# Patient Record
Sex: Male | Born: 2000 | Race: White | Hispanic: No | Marital: Single | Smoking: Never smoker
Health system: Southern US, Community
[De-identification: ages and names within clinical notes are randomized; demographics above are authoritative.]

## PROBLEM LIST (undated history)

## (undated) DIAGNOSIS — L709 Acne, unspecified: Secondary | ICD-10-CM

## (undated) DIAGNOSIS — F909 Attention-deficit hyperactivity disorder, unspecified type: Secondary | ICD-10-CM

## (undated) DIAGNOSIS — Z8489 Family history of other specified conditions: Secondary | ICD-10-CM

## (undated) HISTORY — PX: ADENOIDECTOMY: SUR15

## (undated) HISTORY — PX: TONSILLECTOMY: SUR1361

---
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
## 2019-01-02 ENCOUNTER — Ambulatory Visit (INDEPENDENT_AMBULATORY_CARE_PROVIDER_SITE_OTHER): Admitting: Plastic Surgery

## 2019-01-02 ENCOUNTER — Other Ambulatory Visit (HOSPITAL_COMMUNITY)
Admission: RE | Admit: 2019-01-02 | Discharge: 2019-01-02 | Disposition: A | Discharge status: Home / Self Care | Source: Ambulatory Visit | Attending: Plastic Surgery | Admitting: Plastic Surgery

## 2019-01-02 ENCOUNTER — Other Ambulatory Visit: Payer: Self-pay

## 2019-01-02 VITALS — BP 130/83 | HR 90 | Temp 98.6°F | Ht 74.5 in | Wt 216.0 lb

## 2019-01-02 DIAGNOSIS — R2241 Localized swelling, mass and lump, right lower limb: Secondary | ICD-10-CM

## 2019-01-02 NOTE — Progress Notes (Signed)
Operative Note   DATE OF OPERATION: 01/02/2019  LOCATION:  [facility]  SURGICAL DEPARTMENT: Plastic Surgery  PREOPERATIVE DIAGNOSES: Right leg mass  POSTOPERATIVE DIAGNOSES:  same  PROCEDURE:  1. Excision of right leg mass measuring 3 cm 2. Complex closure measuring 3 cm  SURGEON: Talmadge Coventry, MD  ANESTHESIA:  Local  COMPLICATIONS: None.   INDICATIONS FOR PROCEDURE:  The patient, Andre Little is a 18 y.o. male born on [birth year], is here for treatment of right leg mass MRN: [id number]  CONSENT:  Informed consent was obtained directly from the patient. Risks, benefits and alternatives were fully discussed. Specific risks including but not limited to bleeding, infection, hematoma, seroma, scarring, pain, infection, wound healing problems, and need for further surgery were all discussed. The patient did have an ample opportunity to have questions answered to satisfaction.   DESCRIPTION OF PROCEDURE:  Local anesthesia was administered. The patient's operative site was prepped and draped in a sterile fashion. A time out was performed and all information was confirmed to be correct.  The lesion was excised with a 15 blade and tenotomy scissors.  It appeared there was a lipoma penetrating the fascia.  This was completely excised.  Hemostasis was obtained.  Circumferential undermining was performed and the skin was advanced and closed in layers with interrupted buried Monocryl sutures and interrupted 3-0 Monocryl for the skin.  The lesion excised measured 3 cm, and the total length of closure measured 3 cm.    The patient tolerated the procedure well.  There were no complications.

## 2019-01-04 LAB — SURGICAL PATHOLOGY

## 2019-09-12 IMAGING — DX DG HAND COMPLETE 3+V*R*
3 series · 3 of 3 positions shown · non-contrast
Comparison: None.

CLINICAL DATA: Dog bites to the right hand.

EXAM:
RIGHT HAND - COMPLETE 3+ VIEW

[x hand pa right]
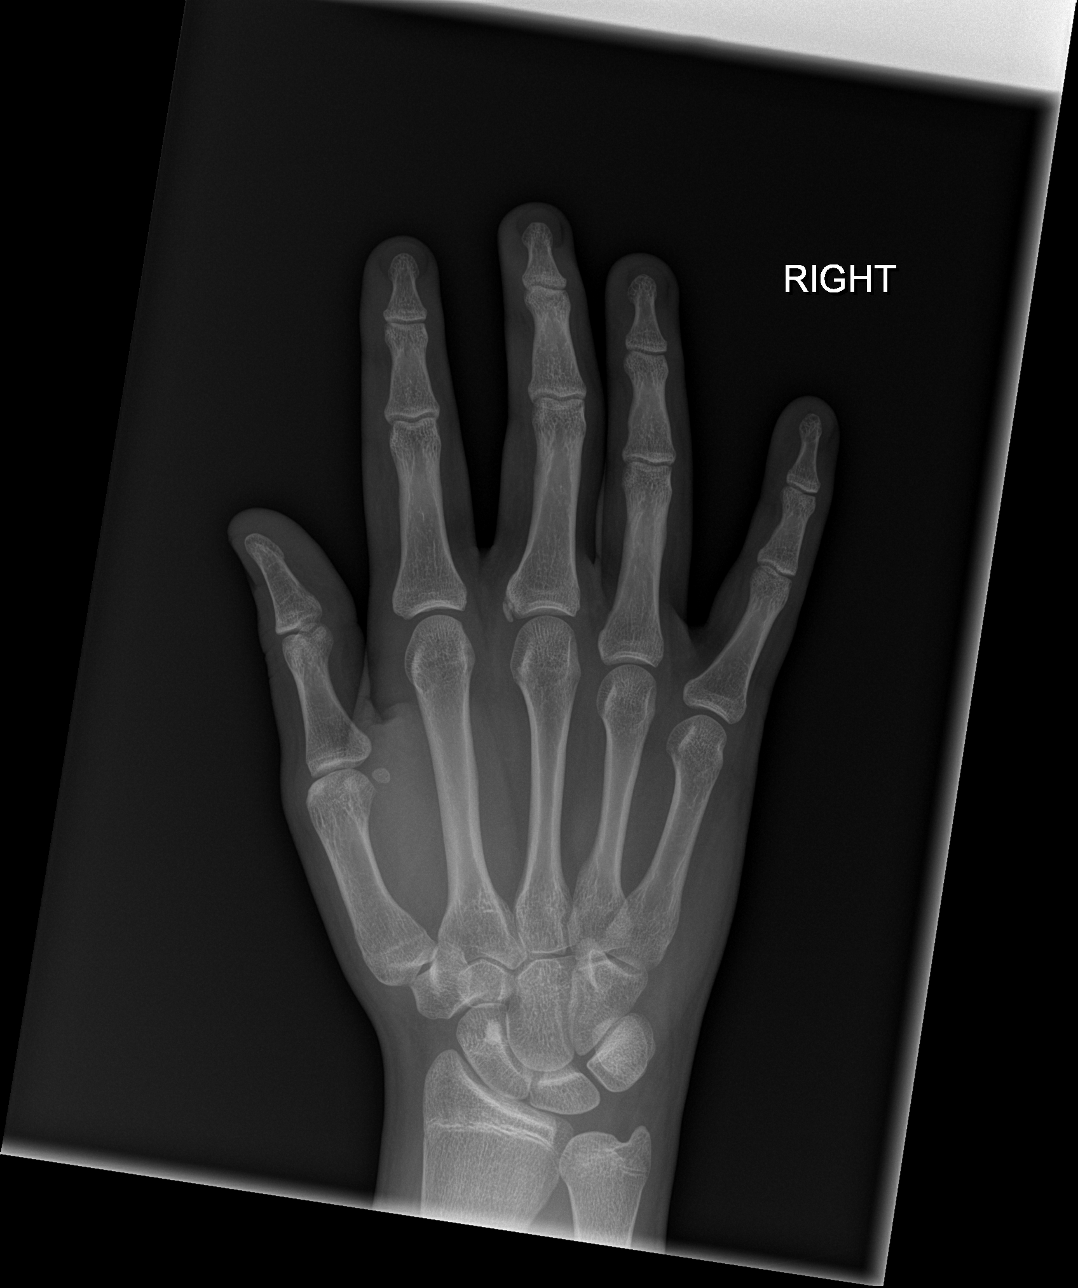

[x hand obl right]
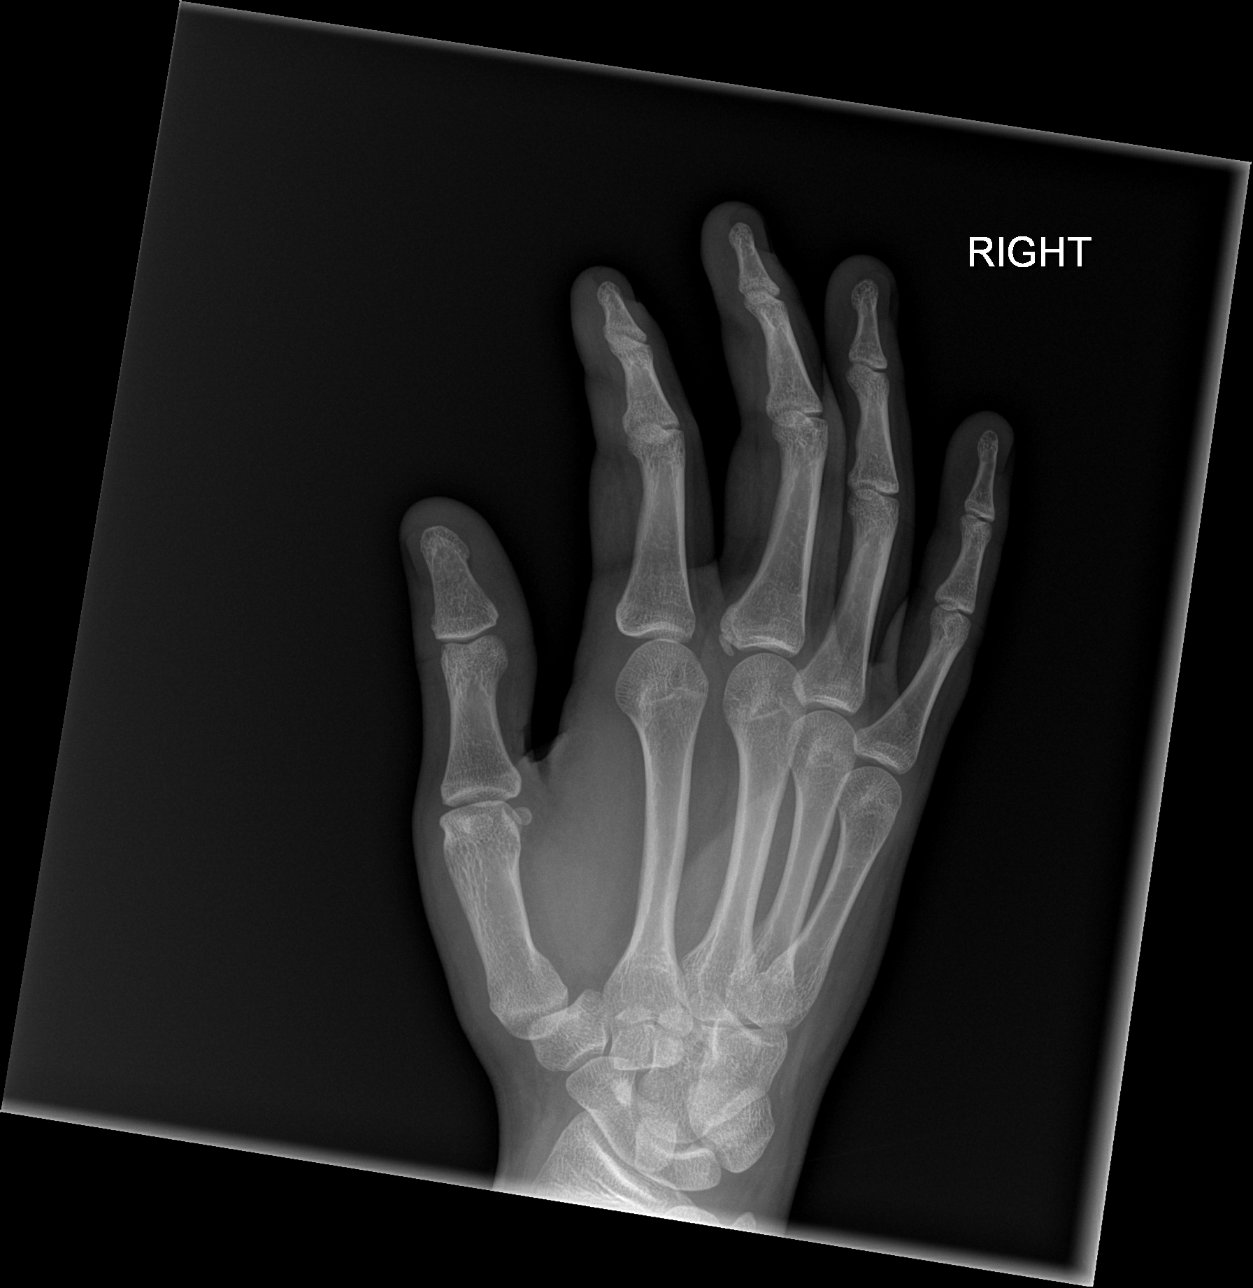

[x hand lat right]
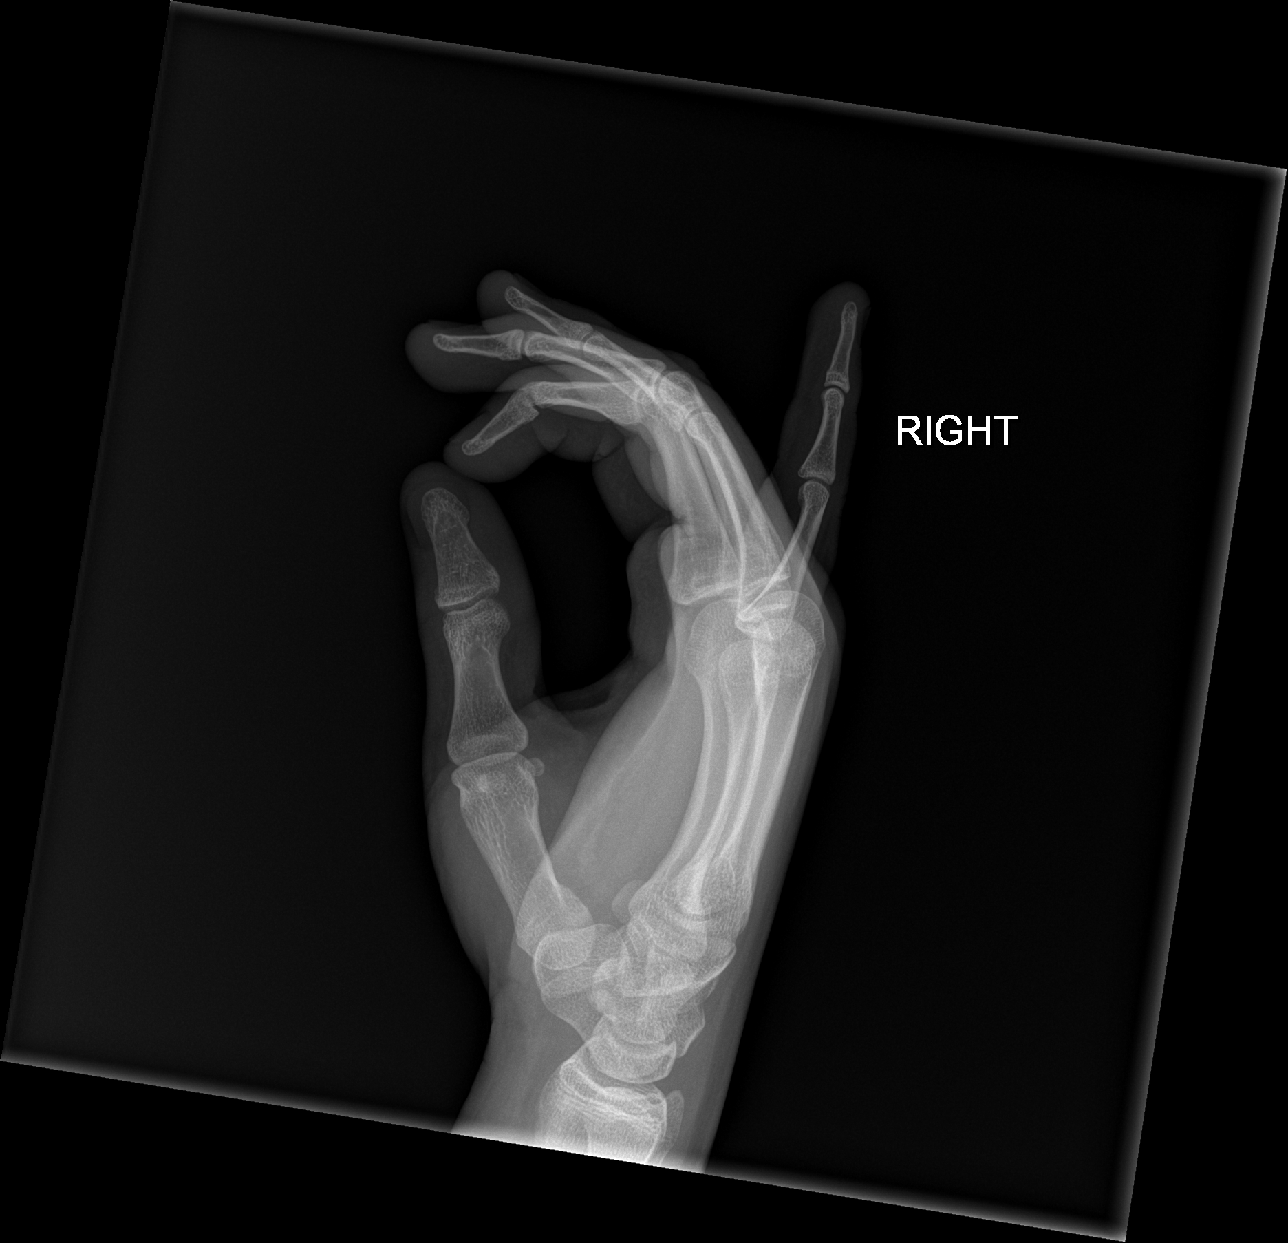

[3 of 3 positions shown; findings below may reference images not displayed]

FINDINGS: There is no evidence of fracture or dislocation. There is focal
calcification in the region of the radial collateral ligament of the
third MCP joint consistent with remote injury. This is not acute.

Benign bone island in the scaphoid.  No acute abnormalities.
IMPRESSION: No acute abnormalities.

## 2019-09-12 IMAGING — DX DG HAND COMPLETE 3+V*L*
3 series · 3 of 3 positions shown · non-contrast
Comparison: No recent prior.

CLINICAL DATA: Multiple dog bites.

EXAM:
LEFT HAND - COMPLETE 3+ VIEW

[x hand pa left]
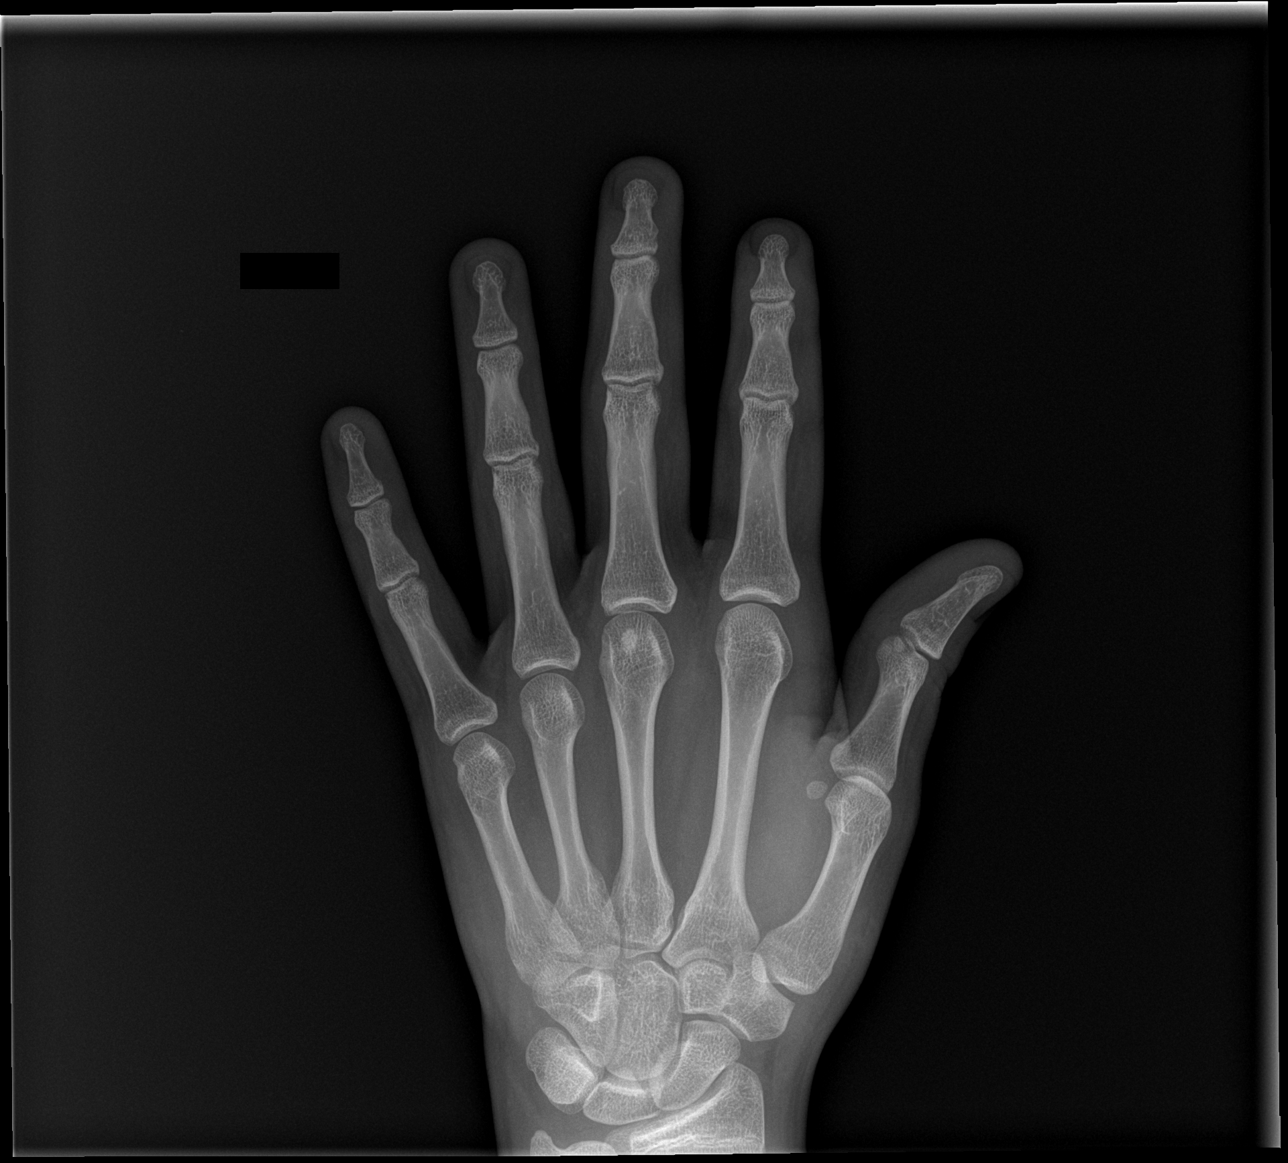

[x hand obl left]
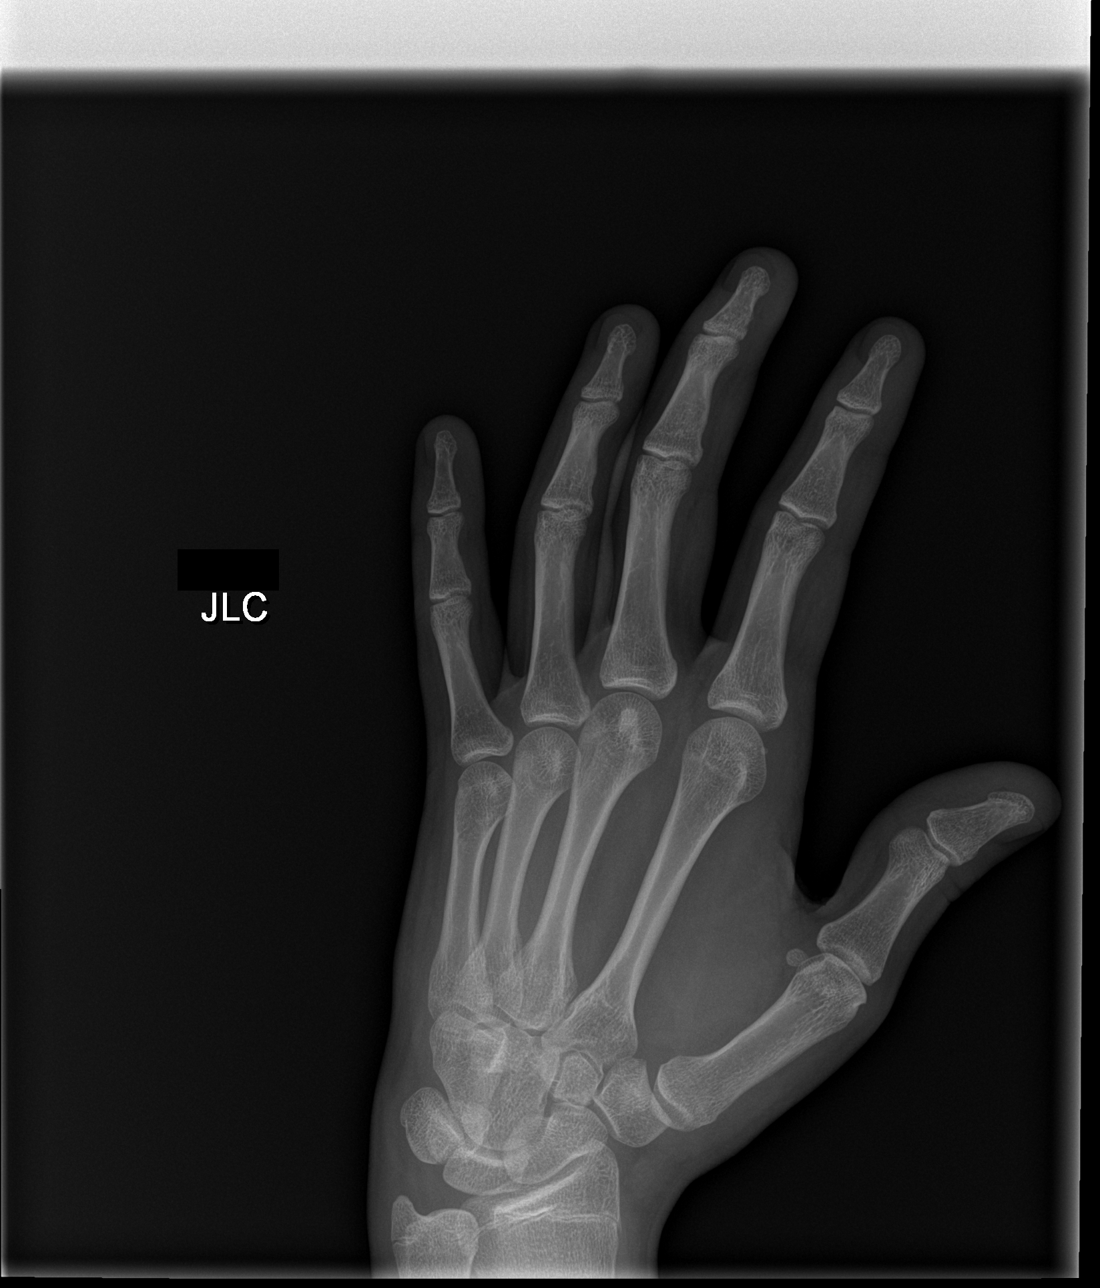

[x hand lat left]
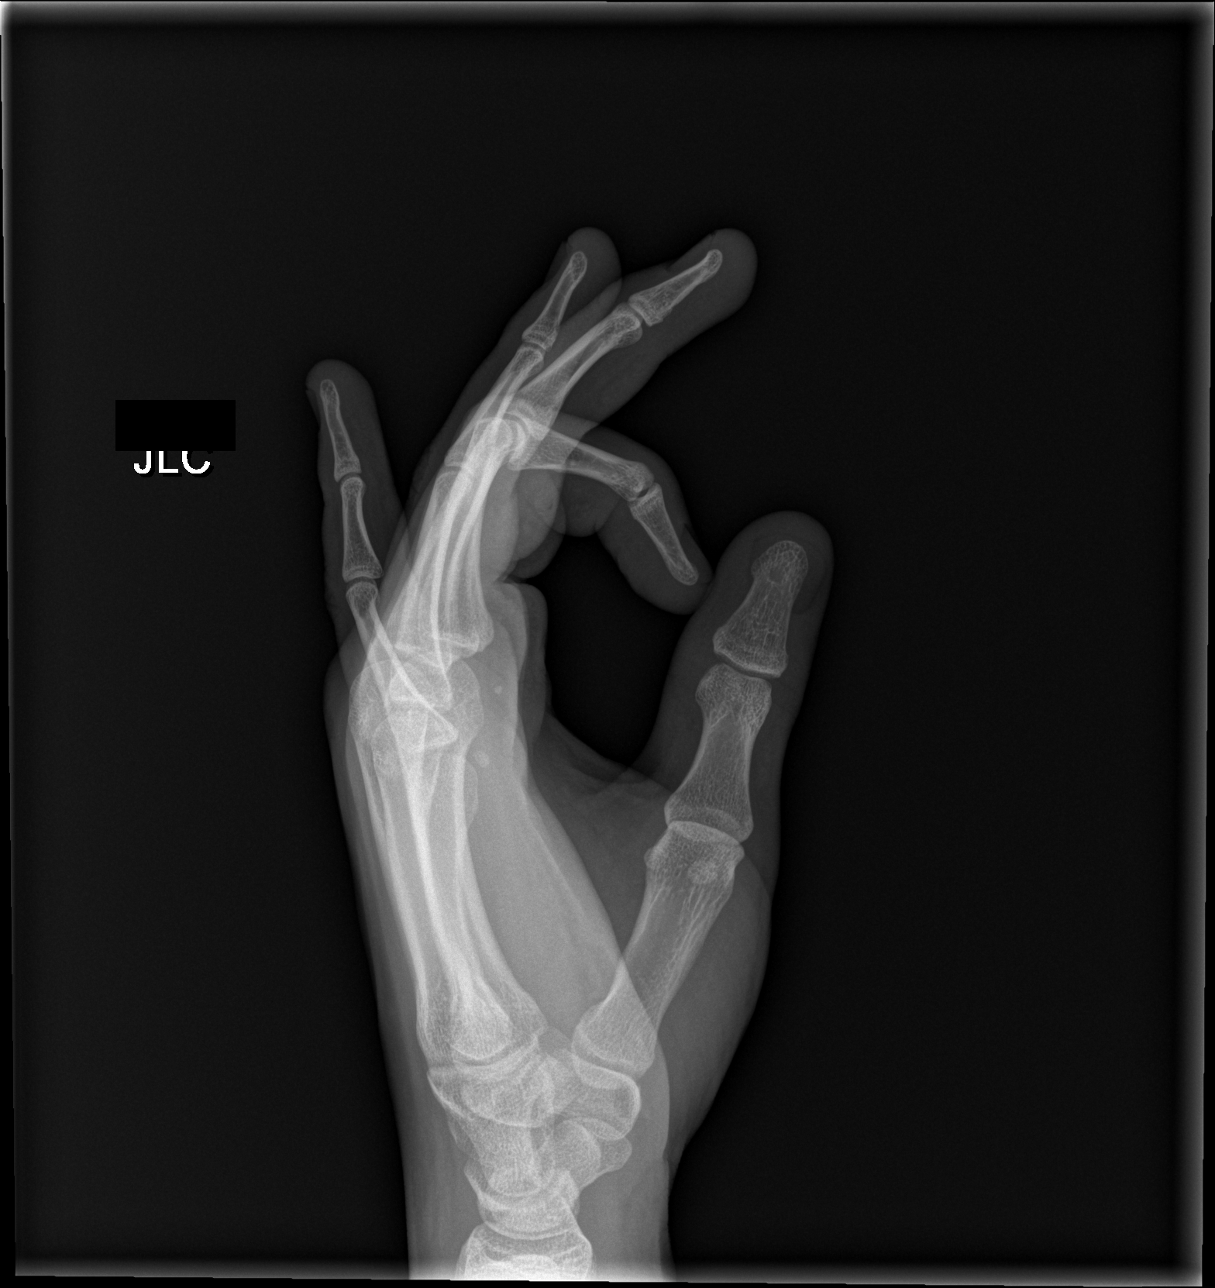

[3 of 3 positions shown; findings below may reference images not displayed]

FINDINGS: No acute bony or joint abnormality identified. No evidence of
fracture or dislocation.
IMPRESSION: No acute abnormality.

## 2019-12-11 ENCOUNTER — Ambulatory Visit: Admitting: Dermatology

## 2020-03-09 ENCOUNTER — Ambulatory Visit: Admitting: Dermatology

## 2020-03-23 ENCOUNTER — Ambulatory Visit: Admitting: Dermatology

## 2020-07-02 ENCOUNTER — Ambulatory Visit (HOSPITAL_COMMUNITY)
Admission: EM | Admit: 2020-07-02 | Discharge: 2020-07-02 | Disposition: A | Discharge status: Home / Self Care | Attending: Physician Assistant | Admitting: Physician Assistant

## 2020-07-02 ENCOUNTER — Encounter (HOSPITAL_COMMUNITY): Payer: Self-pay | Admitting: Emergency Medicine

## 2020-07-02 DIAGNOSIS — R112 Nausea with vomiting, unspecified: Secondary | ICD-10-CM | POA: Diagnosis not present

## 2020-07-02 MED ORDER — ONDANSETRON 4 MG PO TBDP: 4.0000 mg | ORAL_TABLET | Freq: Three times a day (TID) | ORAL | 0 refills | Status: DC | PRN

## 2020-07-02 NOTE — ED Triage Notes (Signed)
Patient c/o vomiting since Tuesday night, some nausea, no diarrhea.  Patient does have some dry heaving, unable to tolerate anything.  Denies any OTC meds.

## 2020-07-02 NOTE — ED Provider Notes (Signed)
[facility]    CSN: [id number] Arrival date & time: 07/02/20  1751      History   Chief Complaint Chief Complaint  Patient presents with  . Emesis    HPI Andre Little is a 20 y.o. male.   20 year old male comes in for 2 day history of nausea/vomiting. Nausea with dry heaving and 2-3 episodes of NBNB emesis per day. Has been able to tolerate oral intake since. States now feels some abdominal soreness from vomiting. Denies diarrhea. Denies fever, chills, body aches. Denies URI symptoms.      Past Medical History:  Diagnosis Date  . Acne   . ADHD (attention deficit hyperactivity disorder)   . Family history of adverse reaction to anesthesia    Mother    Patient Active Problem List   Diagnosis Date Noted  . Hypertrophic scar of skin 09/25/2018  . Occipital headache 06/05/2017  . Bilateral occipital neuralgia 08/01/2016  . ADHD (attention deficit hyperactivity disorder), combined type 07/16/2015  . Generalized anxiety disorder 07/16/2015  . 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 ADENOIDECTOMY    . COLONOSCOPY N/A 03/29/2016   Procedure: COLONOSCOPY;  Surgeon: Adelene Amas, MD;  Location: [facility] ENDOSCOPY;  Service: Gastroenterology;  Laterality: N/A;  . ESOPHAGOGASTRODUODENOSCOPY N/A 03/29/2016   Procedure: ESOPHAGOGASTRODUODENOSCOPY (EGD);  Surgeon: Adelene Amas, MD;  Location: [facility] ENDOSCOPY;  Service: Gastroenterology;  Laterality: N/A;  . TONSILLECTOMY         Home Medications    Prior to Admission medications   Medication Sig Start Date End Date Taking? Authorizing Provider  ibuprofen (ADVIL,MOTRIN) 600 MG tablet Take 1 tablet (600 mg total) by mouth every 6 (six) hours as needed for fever, mild pain or moderate pain. 01/26/16  Yes Everlene Farrier, PA-C  Melatonin 5 MG TABS Take 5 mg by mouth at bedtime.   Yes [provider]  ondansetron (ZOFRAN ODT) 4 MG disintegrating tablet Take 1  tablet (4 mg total) by mouth every 8 (eight) hours as needed for nausea or vomiting. 07/02/20  Yes Kalandra Masters V, PA-C  hyoscyamine (OSCIMIN) 0.125 MG tablet TAKE 1 TO 2 TABLETS AS NEEDED FOR CRAMPING, DIARRHEA EVERY 4 HOURS 03/13/17 07/02/20  Adelene Amas, MD    Family History Family History  Problem Relation Age of Onset  . Miscarriages / [place] Mother   . Asthma Father   . COPD Father   . Cancer Maternal Grandmother   . Stroke Maternal Grandmother   . Breast cancer Maternal Grandmother   . Colon cancer Maternal Grandmother   . Diabetes Paternal Grandmother   . Cancer Paternal Grandfather   . Hearing loss Paternal Grandfather   . Atrial fibrillation Paternal Grandfather   . Migraines Sister     Social History Social History   Tobacco Use  . Smoking status: Never Smoker  . Smokeless tobacco: Never Used  Vaping Use  . Vaping Use: Never used  Substance Use Topics  . Alcohol use: No  . Drug use: No     Allergies   Patient has no known allergies.   Review of Systems Review of Systems  Reason unable to perform ROS: See HPI as above.     Physical Exam Triage Vital Signs ED Triage Vitals  Enc Vitals Group     BP 07/02/20 1847 108/64     Pulse Rate 07/02/20 1847 60     Resp --      Temp 07/02/20 1847  98.8 F (37.1 C)     Temp Source 07/02/20 1847 Oral     SpO2 07/02/20 1847 95 %     Weight --      Height --      Head Circumference --      Peak Flow --      Pain Score 07/02/20 1849 4     Pain Loc --      Pain Edu? --      Excl. in GC? --    No data found.  Updated Vital Signs BP 108/64 (BP Location: Left Arm)   Pulse 60   Temp 98.8 F (37.1 C) (Oral)   SpO2 95%   Visual Acuity Right Eye Distance:   Left Eye Distance:   Bilateral Distance:    Right Eye Near:   Left Eye Near:    Bilateral Near:     Physical Exam Constitutional:      General: He is not in acute distress.    Appearance: Normal appearance. He is not ill-appearing, toxic-appearing  or diaphoretic.  HENT:     Head: Normocephalic and atraumatic.  Cardiovascular:     Rate and Rhythm: Normal rate and regular rhythm.  Pulmonary:     Effort: Pulmonary effort is normal. No respiratory distress.     Comments: LCTAB Abdominal:     General: Bowel sounds are normal.     Palpations: Abdomen is soft.     Tenderness: There is no abdominal tenderness. There is no right CVA tenderness, left CVA tenderness, guarding or rebound.  Musculoskeletal:     Cervical back: Normal range of motion and neck supple.  Skin:    General: Skin is warm and dry.  Neurological:     Mental Status: He is alert and oriented to person, place, and time.      UC Treatments / Results  Labs (all labs ordered are listed, but only abnormal results are displayed) Labs Reviewed - No data to display  EKG   Radiology No results found.  Procedures Procedures (including critical care time)  Medications Ordered in UC Medications - No data to display  Initial Impression / Assessment and Plan / UC Course  I have reviewed the triage vital signs and the nursing notes.  Pertinent labs & imaging results that were available during my care of the patient were reviewed by me and considered in my medical decision making (see chart for details).    Discussed with patient no alarming signs on exam. Zofran for nausea. Push fluids. Bland diet, advance as tolerated. Return precautions given.  Final Clinical Impressions(s) / UC Diagnoses   Final diagnoses:  Intractable vomiting with nausea, unspecified vomiting type    ED Prescriptions    Medication Sig Dispense Auth. Provider   ondansetron (ZOFRAN ODT) 4 MG disintegrating tablet Take 1 tablet (4 mg total) by mouth every 8 (eight) hours as needed for nausea or vomiting. 20 tablet Belinda Fisher, PA-C     PDMP not reviewed this encounter.   Belinda Fisher, PA-C 07/02/20 1921

## 2020-07-02 NOTE — Discharge Instructions (Signed)
Zofran for nausea and vomiting as needed. Keep hydrated, you urine should be clear to pale yellow in color. Bland diet, advance as tolerated. Monitor for any worsening of symptoms, nausea or vomiting not controlled by medication, worsening abdominal pain, fever, go to the emergency department for further evaluation needed.  ° °

## 2021-03-15 ENCOUNTER — Ambulatory Visit
Admission: EM | Admit: 2021-03-15 | Discharge: 2021-03-15 | Disposition: A | Discharge status: Home / Self Care | Payer: TRICARE For Life (TFL) | Attending: Family Medicine | Admitting: Family Medicine

## 2021-03-15 ENCOUNTER — Other Ambulatory Visit: Payer: Self-pay

## 2021-03-15 DIAGNOSIS — J069 Acute upper respiratory infection, unspecified: Secondary | ICD-10-CM | POA: Diagnosis not present

## 2021-03-15 DIAGNOSIS — R42 Dizziness and giddiness: Secondary | ICD-10-CM | POA: Diagnosis not present

## 2021-03-15 LAB — POCT RAPID STREP A (OFFICE): Rapid Strep A Screen: NEGATIVE

## 2021-03-15 MED ORDER — MECLIZINE HCL 25 MG PO TABS: 25.0000 mg | ORAL_TABLET | Freq: Three times a day (TID) | ORAL | 0 refills | Status: DC | PRN

## 2021-03-15 MED ORDER — PROMETHAZINE-DM 6.25-15 MG/5ML PO SYRP: 5.0000 mL | ORAL_SOLUTION | Freq: Four times a day (QID) | ORAL | 0 refills | Status: DC | PRN

## 2021-03-15 NOTE — ED Triage Notes (Signed)
Pt reports sore throat, lightheaded with fast movement, nausea, chest pressure when taking a deep breath.

## 2021-03-15 NOTE — ED Provider Notes (Signed)
[facility]    CSN: [id number] Arrival date & time: 03/15/21  1323      History   Chief Complaint Chief Complaint  Patient presents with   Dizziness   Headache    HPI Andre Little is a 21 y.o. male.   Presenting today with 3-day history of sore throat, nausea, room spinning dizziness with certain fast movements, chest pressure with taking deep breaths, productive cough, fever, fatigue, body aches.  Denies chest pain, abdominal pain, nausea vomiting or diarrhea, shortness of breath.  Taking ibuprofen and Tylenol with minimal relief.  Recent COVID-positive contact.  Home test early on in symptoms was negative.   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 Authorizing Provider  meclizine (ANTIVERT) 25 MG tablet Take 1 tablet (25 mg total) by mouth 3 (three) times daily as needed for dizziness. 03/15/21  Yes Particia Nearing, PA-C  promethazine-dextromethorphan (PROMETHAZINE-DM) 6.25-15  MG/5ML syrup Take 5 mLs by mouth 4 (four) times daily as needed. 03/15/21  Yes Particia Nearing, PA-C  ibuprofen (ADVIL,MOTRIN) 600 MG tablet Take 1 tablet (600 mg total) by mouth every 6 (six) hours as needed for fever, mild pain or moderate pain. 01/26/16   Everlene Farrier, PA-C  Melatonin 5 MG TABS Take 5 mg by mouth at bedtime.    [provider]  ondansetron (ZOFRAN ODT) 4 MG disintegrating tablet Take 1 tablet (4 mg total) by mouth every 8 (eight) hours as needed for nausea or vomiting. 07/02/20   Cathie Hoops, Amy V, PA-C  hyoscyamine (OSCIMIN) 0.125 MG tablet TAKE 1 TO 2 TABLETS AS NEEDED FOR CRAMPING, DIARRHEA EVERY 4 HOURS 03/13/17 07/02/20  Adelene Amas, MD    Family History Family History  Problem Relation Age of Onset   Miscarriages / Stillbirths Mother    Asthma Father    COPD Father    Cancer Maternal Grandmother    Stroke Maternal Grandmother    Breast cancer Maternal Grandmother    Colon cancer Maternal Grandmother    Diabetes Paternal Grandmother    Cancer Paternal Grandfather    Hearing loss Paternal Grandfather    Atrial fibrillation Paternal Grandfather    Migraines Sister     Social History Social History   Tobacco Use   Smoking status: Never   Smokeless tobacco: Never  Vaping Use   Vaping Use: Never used  Substance Use Topics   Alcohol use: Never   Drug use: Never     Allergies   Patient has no known allergies.  Review of Systems Review of Systems Per HPI  Physical Exam Triage Vital Signs ED Triage Vitals  Enc Vitals Group     BP 03/15/21 1356 133/87     Pulse Rate 03/15/21 1356 80     Resp 03/15/21 1356 18     Temp 03/15/21 1356 98.3 F (36.8 C)     Temp Source 03/15/21 1356 Oral     SpO2 03/15/21 1356 97 %     Weight --      Height --      Head Circumference --      Peak Flow --      Pain Score 03/15/21 1357 4     Pain Loc --      Pain Edu? --      Excl. in [place]? --    No data found.  Updated Vital Signs BP 133/87 (BP  Location: Right Arm)    Pulse 80    Temp 98.3 F (36.8 C) (Oral)    Resp 18    SpO2 97%   Visual Acuity Right Eye Distance:   Left Eye Distance:   Bilateral Distance:    Right Eye Near:   Left Eye Near:    Bilateral Near:     Physical Exam Vitals and nursing note reviewed.  Constitutional:      Appearance: Normal appearance. He is well-developed.  HENT:     Head: Atraumatic.     Right Ear: External ear normal.     Left Ear: External ear normal.     Nose: Rhinorrhea present.     Mouth/Throat:     Pharynx: Posterior oropharyngeal erythema present. No oropharyngeal exudate.  Eyes:     Extraocular Movements: Extraocular movements intact.     Conjunctiva/sclera: Conjunctivae normal.     Pupils: Pupils are equal, round, and reactive to light.  Cardiovascular:     Rate and Rhythm: Normal rate and regular rhythm.  Pulmonary:     Effort: Pulmonary effort is normal. No respiratory distress.     Breath sounds: Normal breath sounds. No wheezing or rales.  Musculoskeletal:        General: Normal range of motion.     Cervical back: Normal range of motion and neck supple.  Lymphadenopathy:     Cervical: No cervical adenopathy.  Skin:    General: Skin is warm and dry.  Neurological:     General: No focal deficit present.     Mental Status: He is alert and oriented to person, place, and time.     Cranial Nerves: No cranial nerve deficit.     Motor: No weakness.     Gait: Gait normal.  Psychiatric:        Mood and Affect: Mood normal.        Behavior: Behavior normal.        Thought Content: Thought content normal.        Judgment: Judgment normal.     UC Treatments / Results  Labs (all labs ordered are listed, but only abnormal results are displayed) Labs Reviewed  COVID-19, FLU A+B NAA  POCT RAPID STREP A (OFFICE)    EKG   Radiology No results found.  Procedures Procedures (including critical care time)  Medications Ordered in UC Medications - No data to  display  Initial Impression / Assessment and Plan / UC Course  I have reviewed the triage vital signs and the nursing notes.  Pertinent labs & imaging results that were available during my care of  the patient were reviewed by me and considered in my medical decision making (see chart for details).     Vital signs benign and reassuring, exam overall reassuring and suspect viral upper respiratory infection.  Also appears to have some vertigo type component with his illness.  No focal neurologic deficits on exam.  Will treat with meclizine, Phenergan DM, supportive over-the-counter medications and home care.  Work note given.  Return for acutely worsening symptoms.  COVID and flu testing pending.  Final Clinical Impressions(s) / UC Diagnoses   Final diagnoses:  Viral URI with cough  Vertigo   Discharge Instructions   None    ED Prescriptions     Medication Sig Dispense Auth. Provider   meclizine (ANTIVERT) 25 MG tablet Take 1 tablet (25 mg total) by mouth 3 (three) times daily as needed for dizziness. 10 tablet Volney American, [place]   promethazine-dextromethorphan (PROMETHAZINE-DM) 6.25-15 MG/5ML syrup Take 5 mLs by mouth 4 (four) times daily as needed. 100 mL Volney American, [place]      PDMP not reviewed this encounter.   Volney American, [place] 03/15/21 1454

## 2021-03-16 LAB — COVID-19, FLU A+B NAA
Influenza A, NAA: NOT DETECTED
Influenza B, NAA: NOT DETECTED
SARS-CoV-2, NAA: NOT DETECTED

## 2021-12-27 ENCOUNTER — Emergency Department (HOSPITAL_COMMUNITY)
Admission: EM | Admit: 2021-12-27 | Discharge: 2021-12-27 | Disposition: A | Discharge status: Home / Self Care | Attending: Emergency Medicine | Admitting: Emergency Medicine

## 2021-12-27 ENCOUNTER — Other Ambulatory Visit: Payer: Self-pay

## 2021-12-27 ENCOUNTER — Encounter (HOSPITAL_COMMUNITY): Payer: Self-pay | Admitting: *Deleted

## 2021-12-27 DIAGNOSIS — S0502XA Injury of conjunctiva and corneal abrasion without foreign body, left eye, initial encounter: Secondary | ICD-10-CM | POA: Insufficient documentation

## 2021-12-27 DIAGNOSIS — H5712 Ocular pain, left eye: Secondary | ICD-10-CM | POA: Diagnosis present

## 2021-12-27 DIAGNOSIS — X58XXXA Exposure to other specified factors, initial encounter: Secondary | ICD-10-CM | POA: Diagnosis not present

## 2021-12-27 MED ORDER — OFLOXACIN 0.3 % OP SOLN: 1.0000 [drp] | Freq: Four times a day (QID) | OPHTHALMIC | 0 refills | Status: DC

## 2021-12-27 MED ORDER — TETRACAINE HCL 0.5 % OP SOLN
2.0000 [drp] | Freq: Once | OPHTHALMIC | Status: AC
Start: 2021-12-27 — End: 2021-12-27
  Administered 2021-12-27: 2 [drp] via OPHTHALMIC
  Filled 2021-12-27: qty 4

## 2021-12-27 MED ORDER — FLUORESCEIN SODIUM 1 MG OP STRP
1.0000 | ORAL_STRIP | Freq: Once | OPHTHALMIC | Status: AC
  Administered 2021-12-27: 1 via OPHTHALMIC
  Filled 2021-12-27: qty 1

## 2021-12-27 NOTE — Discharge Instructions (Addendum)
You have been seen today for your complaint of left eye pain. Your exam showed that you have a corneal abrasion of the left eye Your discharge medications include ofloxacin.  This is an antibiotic drop.  You should use it as prescribed.  You should use it for the next 7 days. Follow up with: Your primary care provider in 3 to 4 days if it is not getting better Please seek immediate medical care if you develop any of the following symptoms: You have severe eye pain that does not get better with medicine. You have vision loss. At this time there does not appear to be the presence of an emergent medical condition, however there is always the potential for conditions to change. Please read and follow the below instructions.  Do not take your medicine if  develop an itchy rash, swelling in your mouth or lips, or difficulty breathing; call 911 and seek immediate emergency medical attention if this occurs.  You may review your lab tests and imaging results in their entirety on your MyChart account.  Please discuss all results of fully with your primary care provider and other specialist at your follow-up visit.  Note: Portions of this text may have been transcribed using voice recognition software. Every effort was made to ensure accuracy; however, inadvertent computerized transcription errors may still be present.
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
## 2021-12-27 NOTE — ED Notes (Signed)
Patient verbalizes understanding of discharge instructions. Opportunity for questioning and answers were provided. Armband removed by staff, pt discharged from ED. Ambulated out to lobby  

## 2021-12-27 NOTE — ED Notes (Signed)
Patient verbalizes understanding of discharge instructions. Opportunity for questioning and answers were provided. Armband removed by staff, pt discharged from ED. Ambulated uot to lobby
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
## 2021-12-27 NOTE — ED Notes (Signed)
Tonopen also at bedside

## 2022-03-23 ENCOUNTER — Other Ambulatory Visit: Payer: Self-pay

## 2022-03-23 ENCOUNTER — Emergency Department (HOSPITAL_COMMUNITY)
Admission: EM | Admit: 2022-03-23 | Discharge: 2022-03-23 | Disposition: A | Discharge status: Home / Self Care | Attending: Emergency Medicine | Admitting: Emergency Medicine

## 2022-03-23 ENCOUNTER — Encounter (HOSPITAL_COMMUNITY): Payer: Self-pay

## 2022-03-23 DIAGNOSIS — U071 COVID-19: Secondary | ICD-10-CM | POA: Diagnosis not present

## 2022-03-23 DIAGNOSIS — R519 Headache, unspecified: Secondary | ICD-10-CM | POA: Diagnosis present

## 2022-03-23 LAB — RESP PANEL BY RT-PCR (RSV, FLU A&B, COVID)  RVPGX2
Influenza A by PCR: NEGATIVE
Influenza B by PCR: NEGATIVE
Resp Syncytial Virus by PCR: NEGATIVE
SARS Coronavirus 2 by RT PCR: POSITIVE — AB

## 2022-03-23 MED ORDER — PSEUDOEPHEDRINE HCL 30 MG PO TABS: 60.0000 mg | ORAL_TABLET | Freq: Four times a day (QID) | ORAL | 0 refills | Status: DC | PRN

## 2022-03-23 NOTE — Discharge Instructions (Addendum)
Thank you for allowing me to be part of your care today.  I have sent a prescription for Sudafed to your pharmacy.  You may take this medication every 6 hours as needed for sinus congestion and pressure.  If you have congestion, but are not experiencing sinus pressure that is bothersome, I recommend and over-the-counter antihistamine such as Zyrtec (cetirizine) or Claritin (loratadine).  These are non-drowsy medications.    For headache, body aches, and should you develop fever: take 800 mg of ibuprofen every 6-8 hours as needed.  You may alternate this with Tylenol 1000 mg.  DO NOT exceed 3200 mg of ibuprofen in a 24-hour period.  DO NOT exceed 4000 mg of Tylenol in a 24-hour period.   I recommend getting plenty of rest and staying well hydrated.  Follow-up with your primary care provider if you have prolonged symptoms.    Please follow current CDC guidelines regarding quarantining and mask wearing: If you test positive for COVID-19, stay home for at least 5 days and isolate from others in your home.  Day 1 is the first full day after the day your symptoms started.  You may end isolation after day 5 if you are fever-free for 24 hours (without the use of fever-reducing medication).

## 2022-03-23 NOTE — ED Provider Notes (Signed)
[facility] Provider Note   CSN: [id number] Arrival date & time: 03/23/22  [id number]     History  Chief Complaint  Patient presents with   Chills    Andre Little is a 22 y.o. male presents to the ED complaining of body aches, chills, congestion, sinus pressure, rhinorrhea, sore throat and headache for the past 3 days.  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  He has not taken any over-the-counter medication for his symptoms.  Denies chest tightness, cough, shortness of breath, nausea, vomiting, diarrhea, trouble swallowing, otalgia, fever, chills.         Home Medications Prior to Admission medications   Medication Sig Start Date End Date Taking? Authorizing Provider  pseudoephedrine (SUDAFED) 30 MG tablet Take 2 tablets (60 mg total) by mouth every 6 (six) hours as needed for congestion. 03/23/22  Yes [name], PA  ibuprofen (ADVIL,MOTRIN) 600 MG tablet Take 1 tablet (600 mg total) by mouth every 6 (six) hours as needed for fever, mild pain or moderate pain. 01/26/16   Waynetta Pean, PA-C  meclizine (ANTIVERT) 25 MG tablet Take 1 tablet (25 mg total) by mouth 3 (three) times daily as needed for dizziness. 03/15/21   Volney American, PA-C  Melatonin 5 MG TABS Take 5 mg by mouth at bedtime.    [provider]  ofloxacin (OCUFLOX) 0.3 % ophthalmic solution Place 1 drop into the left eye 4 (four) times daily. 12/27/21   Schutt, G[place]on Folk, PA-C  ondansetron (ZOFRAN ODT) 4 MG disintegrating tablet Take 1 tablet (4 mg total) by mouth every 8 (eight) hours as needed for nausea or vomiting. 07/02/20   Tasia Catchings, Amy V, PA-C  promethazine-dextromethorphan (PROMETHAZINE-DM) 6.25-15 MG/5ML syrup Take 5 mLs by mouth 4 (four) times daily as needed. 03/15/21   Volney American, PA-C  hyoscyamine (OSCIMIN) 0.125 MG tablet TAKE 1 TO 2 TABLETS AS NEEDED FOR CRAMPING, DIARRHEA EVERY 4 HOURS 03/13/17 07/02/20  Joycelyn Rua, MD       Allergies    Patient has no known allergies.    Review of Systems   Review of Systems  Constitutional:  Negative for chills and fever.  HENT:  Positive for congestion, rhinorrhea, sinus pressure and sore throat. Negative for ear pain and trouble swallowing.   Respiratory:  Negative for cough, chest tightness and shortness of breath.   Gastrointestinal:  Negative for diarrhea, nausea and vomiting.  Neurological:  Positive for headaches.    Physical Exam Updated Vital Signs BP (!) 149/91   Pulse 94   Temp 98.5 F (36.9 C)   Resp 18   Wt 83 kg   SpO2 96%   BMI 23.49 kg/m  Physical Exam Vitals and nursing note reviewed.  Constitutional:      General: He is not in acute distress.    Appearance: Normal appearance. He is not ill-appearing or diaphoretic.  HENT:     Nose: Congestion and rhinorrhea present. Rhinorrhea is clear.     Mouth/Throat:     Lips: Pink.     Mouth: Mucous membranes are moist.     Pharynx: Oropharynx is clear. Uvula midline. Posterior oropharyngeal erythema present. No pharyngeal swelling or oropharyngeal exudate.  Cardiovascular:     Rate and Rhythm: Normal rate and regular rhythm.     Heart sounds: Normal heart sounds.  Pulmonary:     Effort: Pulmonary effort is normal.     Breath sounds: Normal breath sounds  and air entry. No wheezing.  Lymphadenopathy:     Cervical: No cervical adenopathy.  Neurological:     Mental Status: He is alert. Mental status is at baseline.  Psychiatric:        Mood and Affect: Mood normal.        Behavior: Behavior normal.     ED Results / Procedures / Treatments   Labs (all labs ordered are listed, but only abnormal results are displayed) Labs Reviewed  RESP PANEL BY RT-PCR (RSV, FLU A&B, COVID)  RVPGX2 - Abnormal; Notable for the following components:      Result Value   SARS Coronavirus 2 by RT PCR POSITIVE (*)    All other components within normal limits    EKG None  Radiology No results  found.  Procedures Procedures    Medications Ordered in ED Medications - No data to display  ED Course/ Medical Decision Making/ A&P                             Medical Decision Making Risk OTC drugs.   This patient presents to the ED with chief complaint(s) of flu-like symptoms for the past 3 days.  The complaint involves an extensive differential diagnosis and also carries with it a high risk of complications and morbidity.    The differential diagnosis includes COVID, influenza, RSV, adenovirus, URI of other viral etiology, viral pharyngitis   The initial plan is to obtain respiratory swab  Initial Assessment:   Exam significant for erythematous posterior oropharynx with post nasal drip present, clear rhinorrhea, and congestion.  Heart rate is normal in the 90s with regular rhythm.  Lungs are clear to auscultation bilaterally.  Patient does not appear to be in acute distress.  He speaks in full sentences.  He has frontal sinus pressure as well.    Independent ECG/labs interpretation:  The following labs were independently interpreted:  COVID+, RSV and influenza negative  Independent visualization and interpretation of imaging: I independently visualized the following imaging with scope of interpretation limited to determining acute life threatening conditions related to emergency care: Not indicated  Treatment and Reassessment: Discussed supportive care measures and medications he can take for symptoms including NSAIDs, Tylenol, 2nd generation antihistamines, and Sudafed for sinus pressure.  Encouraged him to rest and stay well hydrated.  Discussed CDC guidelines for isolation.  Patient states he will be going to [facility] following ED visit.  Recommended PCP follow-up if he has prolonged symptoms.    Disposition:   The patient has been appropriately medically screened and/or stabilized in the ED. I have low suspicion for any other emergent medical condition which would  require further screening, evaluation or treatment in the ED or require inpatient management. At time of discharge the patient is hemodynamically stable and in no acute distress. I have discussed work-up results and diagnosis with patient and answered all questions. Patient is agreeable with discharge plan. We discussed strict return precautions for returning to the emergency department and they verbalized understanding.            Final Clinical Impression(s) / ED Diagnoses Final diagnoses:  [id number]    Rx / DC Orders ED Discharge Orders          Ordered    pseudoephedrine (SUDAFED) 30 MG tablet  Every 6 hours PRN        03/23/22 0718  Theressa Stamps R, [place] 03/23/22 0732    Valarie Merino, MD 03/23/22 [id number]

## 2022-03-23 NOTE — ED Triage Notes (Signed)
Body aches, chills, runny nose, headache x2 days.

## 2022-03-30 ENCOUNTER — Ambulatory Visit: Admitting: Dermatology

## 2022-06-17 ENCOUNTER — Encounter (HOSPITAL_BASED_OUTPATIENT_CLINIC_OR_DEPARTMENT_OTHER): Payer: Self-pay | Admitting: Emergency Medicine

## 2022-06-17 ENCOUNTER — Emergency Department (HOSPITAL_BASED_OUTPATIENT_CLINIC_OR_DEPARTMENT_OTHER)
Admission: EM | Admit: 2022-06-17 | Discharge: 2022-06-17 | Disposition: A | Discharge status: Home / Self Care | Attending: Emergency Medicine | Admitting: Emergency Medicine

## 2022-06-17 ENCOUNTER — Emergency Department (HOSPITAL_BASED_OUTPATIENT_CLINIC_OR_DEPARTMENT_OTHER)

## 2022-06-17 ENCOUNTER — Other Ambulatory Visit: Payer: Self-pay

## 2022-06-17 DIAGNOSIS — R1013 Epigastric pain: Secondary | ICD-10-CM | POA: Diagnosis not present

## 2022-06-17 DIAGNOSIS — R1010 Upper abdominal pain, unspecified: Secondary | ICD-10-CM

## 2022-06-17 DIAGNOSIS — R109 Unspecified abdominal pain: Secondary | ICD-10-CM | POA: Diagnosis present

## 2022-06-17 DIAGNOSIS — R1012 Left upper quadrant pain: Secondary | ICD-10-CM | POA: Diagnosis not present

## 2022-06-17 LAB — URINALYSIS, ROUTINE W REFLEX MICROSCOPIC
Bilirubin Urine: NEGATIVE
Glucose, UA: NEGATIVE mg/dL
Hgb urine dipstick: NEGATIVE
Ketones, ur: NEGATIVE mg/dL
Leukocytes,Ua: NEGATIVE
Nitrite: NEGATIVE
Protein, ur: NEGATIVE mg/dL
Specific Gravity, Urine: 1.019 (ref 1.005–1.030)
pH: 5.5 (ref 5.0–8.0)

## 2022-06-17 LAB — COMPREHENSIVE METABOLIC PANEL
ALT: 15 U/L (ref 0–44)
AST: 16 U/L (ref 15–41)
Albumin: 5 g/dL (ref 3.5–5.0)
Alkaline Phosphatase: 58 U/L (ref 38–126)
Anion gap: 10 (ref 5–15)
BUN: 15 mg/dL (ref 6–20)
CO2: 26 mmol/L (ref 22–32)
Calcium: 9.8 mg/dL (ref 8.9–10.3)
Chloride: 103 mmol/L (ref 98–111)
Creatinine, Ser: 0.84 mg/dL (ref 0.61–1.24)
GFR, Estimated: >60 mL/min (ref >60)
Glucose, Bld: 105 mg/dL — ABNORMAL HIGH (ref 70–99)
Potassium: 4 mmol/L (ref 3.5–5.1)
Sodium: 139 mmol/L (ref 135–145)
Total Bilirubin: 0.5 mg/dL (ref 0.3–1.2)
Total Protein: 7.9 g/dL (ref 6.5–8.1)

## 2022-06-17 LAB — CBC
HCT: 43.4 % (ref 39.0–52.0)
Hemoglobin: 14.8 g/dL (ref 13.0–17.0)
MCH: 29.9 pg (ref 26.0–34.0)
MCHC: 34.1 g/dL (ref 30.0–36.0)
MCV: 87.7 fL (ref 80.0–100.0)
Platelets: 240 10*3/uL (ref 150–400)
RBC: 4.95 MIL/uL (ref 4.22–5.81)
RDW: 12.5 % (ref 11.5–15.5)
WBC: 6.2 10*3/uL (ref 4.0–10.5)
nRBC: 0 % (ref 0.0–0.2)

## 2022-06-17 LAB — LIPASE, BLOOD: Lipase: 12 U/L (ref 11–51)

## 2022-06-17 MED ORDER — DICYCLOMINE HCL 20 MG PO TABS: 20.0000 mg | ORAL_TABLET | Freq: Every day | ORAL | 0 refills | Status: DC

## 2022-06-17 MED ORDER — IOHEXOL 300 MG/ML  SOLN
100.0000 mL | Freq: Once | INTRAMUSCULAR | Status: AC | PRN
  Administered 2022-06-17: 80 mL via INTRAVENOUS

## 2022-06-17 MED ORDER — FAMOTIDINE IN NACL 20-0.9 MG/50ML-% IV SOLN
20.0000 mg | Freq: Once | INTRAVENOUS | Status: AC
  Administered 2022-06-17: 20 mg via INTRAVENOUS
  Filled 2022-06-17: qty 50

## 2022-06-17 MED ORDER — DICYCLOMINE HCL 10 MG PO CAPS
10.0000 mg | ORAL_CAPSULE | Freq: Once | ORAL | Status: AC
  Administered 2022-06-17: 10 mg via ORAL
  Filled 2022-06-17: qty 1

## 2022-06-17 MED ORDER — PANTOPRAZOLE SODIUM 40 MG PO TBEC: 40.0000 mg | DELAYED_RELEASE_TABLET | Freq: Every day | ORAL | 0 refills | Status: DC

## 2022-06-17 MED ORDER — SODIUM CHLORIDE 0.9 % IV BOLUS
1000.0000 mL | Freq: Once | INTRAVENOUS | Status: AC
  Administered 2022-06-17: 1000 mL via INTRAVENOUS

## 2022-06-17 MED ORDER — ALUM & MAG HYDROXIDE-SIMETH 200-200-20 MG/5ML PO SUSP
30.0000 mL | Freq: Once | ORAL | Status: AC
Start: 2022-06-17 — End: 2022-06-17
  Administered 2022-06-17: 30 mL via ORAL
  Filled 2022-06-17: qty 30

## 2022-06-17 NOTE — ED Provider Notes (Signed)
EMERGENCY DEPARTMENT AT [facility] Provider Note   CSN: [id number] Arrival date & time: 06/17/22  1510     History  Chief Complaint  Patient presents with   Abdominal Pain    SLATE BOEDIGHEIMER is a 22 y.o. male who presents emergency department with concerns for abdominal pain onset 2 days.  Denies sick contacts.  Has tried Tylenol and ibuprofen at home for his symptoms without relief.  Has associated watery nonbloody diarrhea.  Denies chest pain, shortness of breath, nausea, vomiting, constipation, urinary symptoms, fever.  Patient still has his gallbladder and appendix.  Denies EtOH use.  Denies GERD diagnosis.  The history is provided by the patient. No language interpreter was used.       Home Medications Prior to Admission medications   Medication Sig Start Date End Date Taking? Authorizing Provider  dicyclomine (BENTYL) 20 MG tablet Take 1 tablet (20 mg total) by mouth daily. 06/17/22  Yes Dimitrius Steedman A, PA-C  pantoprazole (PROTONIX) 40 MG tablet Take 1 tablet (40 mg total) by mouth daily. 06/17/22 07/17/22 Yes Kimyata Milich A, PA-C  ibuprofen (ADVIL,MOTRIN) 600 MG tablet Take 1 tablet (600 mg total) by mouth every 6 (six) hours as needed for fever, mild pain or moderate pain. 01/26/16   Everlene Farrier, PA-C  meclizine (ANTIVERT) 25 MG tablet Take 1 tablet (25 mg total) by mouth 3 (three) times daily as needed for dizziness. 03/15/21   Particia Nearing, PA-C  Melatonin 5 MG TABS Take 5 mg by mouth at bedtime.    [provider]  ofloxacin (OCUFLOX) 0.3 % ophthalmic solution Place 1 drop into the left eye 4 (four) times daily. 12/27/21   Schutt, Edsel Petrin, PA-C  ondansetron (ZOFRAN ODT) 4 MG disintegrating tablet Take 1 tablet (4 mg total) by mouth every 8 (eight) hours as needed for nausea or vomiting. 07/02/20   Cathie Hoops, Amy V, PA-C  promethazine-dextromethorphan (PROMETHAZINE-DM) 6.25-15 MG/5ML syrup Take 5 mLs by mouth 4 (four) times daily as  needed. 03/15/21   Particia Nearing, PA-C  pseudoephedrine (SUDAFED) 30 MG tablet Take 2 tablets (60 mg total) by mouth every 6 (six) hours as needed for congestion. 03/23/22   Clark, Meghan R, PA-C  hyoscyamine (OSCIMIN) 0.125 MG tablet TAKE 1 TO 2 TABLETS AS NEEDED FOR CRAMPING, DIARRHEA EVERY 4 HOURS 03/13/17 07/02/20  Adelene Amas, MD      Allergies    Patient has no known allergies.    Review of Systems   Review of Systems  Gastrointestinal:  Positive for abdominal pain.  All other systems reviewed and are negative.   Physical Exam Updated Vital Signs BP (!) 135/94 (BP Location: Right Arm)   Pulse 87   Temp 98.3 F (36.8 C) (Oral)   Resp 18   Ht 6' 2.5" (1.892 m)   Wt 88.5 kg   SpO2 98%   BMI 24.70 kg/m  Physical Exam Vitals and nursing note reviewed.  Constitutional:      General: He is not in acute distress.    Appearance: He is not diaphoretic.  HENT:     Head: Normocephalic and atraumatic.     Mouth/Throat:     Pharynx: No oropharyngeal exudate.  Eyes:     General: No scleral icterus.    Conjunctiva/sclera: Conjunctivae normal.  Cardiovascular:     Rate and Rhythm: Normal rate and regular rhythm.     Pulses: Normal pulses.     Heart sounds: Normal heart sounds.  Pulmonary:  Effort: Pulmonary effort is normal. No respiratory distress.     Breath sounds: Normal breath sounds. No wheezing.  Abdominal:     General: Bowel sounds are normal.     Palpations: Abdomen is soft. There is no mass.     Tenderness: There is abdominal tenderness in the epigastric area and left upper quadrant. There is no guarding or rebound.     Comments: TTP noted to epigastric and LUQ region  Musculoskeletal:        General: Normal range of motion.     Cervical back: Normal range of motion and neck supple.  Skin:    General: Skin is warm and dry.  Neurological:     Mental Status: He is alert.  Psychiatric:        Behavior: Behavior normal.     ED Results / Procedures /  Treatments   Labs (all labs ordered are listed, but only abnormal results are displayed) Labs Reviewed  COMPREHENSIVE METABOLIC PANEL - Abnormal; Notable for the following components:      Result Value   Glucose, Bld 105 (*)    All other components within normal limits  LIPASE, BLOOD  CBC  URINALYSIS, ROUTINE W REFLEX MICROSCOPIC    EKG None  Radiology CT ABDOMEN PELVIS W CONTRAST  Result Date: 06/17/2022 CLINICAL DATA:  Left upper quadrant abdominal pain. EXAM: CT ABDOMEN AND PELVIS WITH CONTRAST TECHNIQUE: Multidetector CT imaging of the abdomen and pelvis was performed using the standard protocol following bolus administration of intravenous contrast. RADIATION DOSE REDUCTION: This exam was performed according to the departmental dose-optimization program which includes automated exposure control, adjustment of the mA and/or kV according to patient size and/or use of iterative reconstruction technique. CONTRAST:  80mL OMNIPAQUE IOHEXOL 300 MG/ML  SOLN COMPARISON:  CT examination dated April 12, 2011 FINDINGS: Lower chest: No acute abnormality. Hepatobiliary: No focal liver abnormality is seen. No gallstones, gallbladder wall thickening, or biliary dilatation. Pancreas: Unremarkable. No pancreatic ductal dilatation or surrounding inflammatory changes. Spleen: Normal in size without focal abnormality. Adrenals/Urinary Tract: Adrenal glands are unremarkable. Kidneys are normal, without renal calculi, focal lesion, or hydronephrosis. Bladder is unremarkable. Stomach/Bowel: Stomach is within normal limits. Appendix appears normal. No evidence of bowel wall thickening, distention, or inflammatory changes. Vascular/Lymphatic: No significant vascular findings are present. No enlarged abdominal or pelvic lymph nodes. Reproductive: Prostate is unremarkable. Other: No abdominal wall hernia or abnormality. No abdominopelvic ascites. Musculoskeletal: No acute or significant osseous findings. IMPRESSION: No  CT evidence of acute abdominal/pelvic process. Electronically Signed   By: Larose Hires D.O.   On: 06/17/2022 18:04    Procedures Procedures    Medications Ordered in ED Medications  sodium chloride 0.9 % bolus 1,000 mL (0 mLs Intravenous Stopped 06/17/22 1705)  alum & mag hydroxide-simeth (MAALOX/MYLANTA) 200-200-20 MG/5ML suspension 30 mL (30 mLs Oral Given 06/17/22 1607)  famotidine (PEPCID) IVPB 20 mg premix (0 mg Intravenous Stopped 06/17/22 1705)  iohexol (OMNIPAQUE) 300 MG/ML solution 100 mL (80 mLs Intravenous Contrast Given 06/17/22 1719)  dicyclomine (BENTYL) capsule 10 mg (10 mg Oral Given 06/17/22 1816)    ED Course/ Medical Decision Making/ A&P Clinical Course as of 06/17/22 1844  Fri Jun 17, 2022  1812 Re-evaluated and noted improvement of symptoms with treatment regimen. Discussed discharge treatment plan. Pt agreeable at this time. Pt appears safe for discharge. [SB]  1843 Re-evaluated and noted improvement of symptoms with treatment regimen in ED.  [SB]    Clinical Course User Index [SB] Brentton Wardlow,  Grant Henkes A, PA-C                             Medical Decision Making Amount and/or Complexity of Data Reviewed Labs: ordered. Radiology: ordered.  Risk OTC drugs. Prescription drug management.   Patient presents to the emergency department with upper abdominal pain onset 2 days.  Denies sick contacts.  Tried Tylenol ibuprofen at home for symptoms.  Patient afebrile.  On exam patient with mild epigastric and left upper quadrant tenderness to palpation.  Differential diagnosis includes GERD, pancreatitis, cholecystitis, diverticulitis.  Labs:  I ordered, and personally interpreted labs.  The pertinent results include:  Negative lipase, CMP, CBC, urinalysis  Imaging: I ordered imaging studies including CT abdomen pelvis with I independently visualized and interpreted imaging which showed  No CT evidence of acute abdominal/pelvic process.   I agree with the radiologist  interpretation  Medications:  I ordered medication including IV fluids, GI cocktail, Pepcid, Bentyl for symptom management.  Reevaluation of the patient after these medicines and interventions, I reevaluated the patient and found that they have improved I have reviewed the patients home medicines and have made adjustments as needed  Disposition: Presenting suspicious for upper abdominal pain, likely in the setting of GERD.  Doubt concerns at this time for pancreatitis, cholecystitis, diverticulitis. After consideration of the diagnostic results and the patients response to treatment, I feel that the patient would benefit from Discharge home.  Patient sent home with prescription for Bentyl and Protonix.  Offered work note, patient declined at this time.  Supportive care measures and strict return precautions discussed with patient at bedside. Pt acknowledges and verbalizes understanding. Pt appears safe for discharge. Follow up as indicated in discharge paperwork.   This chart was dictated using voice recognition software, Dragon. Despite the best efforts of this provider to proofread and correct errors, errors may still occur which can change documentation meaning.  Final Clinical Impression(s) / ED Diagnoses Final diagnoses:  Upper abdominal pain    Rx / DC Orders ED Discharge Orders          Ordered    dicyclomine (BENTYL) 20 MG tablet  Daily        06/17/22 1814    pantoprazole (PROTONIX) 40 MG tablet  Daily        06/17/22 1814              Choya Tornow A, PA-C 06/17/22 1904    Glyn Ade, MD 06/17/22 2005

## 2022-06-17 NOTE — Discharge Instructions (Addendum)
It was a pleasure taking care of you today!  Your labs and imaging studies didn't show any emergent findings at this time.  You will be sent a prescription for Bentyl and Protonix, take as directed.  Ensure to maintain fluid intake with water, tea, broth, soup, Pedialyte, Gatorade.  Attached is information for the on-call GI specialist, you may call to set up a follow-up appointment regarding today's ED visit.  You may follow-up with your primary care provider regarding today's ED visit.  Return to the emergency department if you experience increasing/worsening symptoms.

## 2023-10-11 ENCOUNTER — Ambulatory Visit (HOSPITAL_COMMUNITY)
Admission: EM | Admit: 2023-10-11 | Discharge: 2023-10-11 | Disposition: A | Discharge status: Home / Self Care | Attending: Family Medicine | Admitting: Family Medicine

## 2023-10-11 ENCOUNTER — Encounter (HOSPITAL_COMMUNITY): Payer: Self-pay

## 2023-10-11 DIAGNOSIS — Z113 Encounter for screening for infections with a predominantly sexual mode of transmission: Secondary | ICD-10-CM | POA: Diagnosis present

## 2023-10-11 LAB — HIV ANTIBODY (ROUTINE TESTING W REFLEX): HIV Screen 4th Generation wRfx: NONREACTIVE

## 2023-10-11 NOTE — Discharge Instructions (Signed)
 We have sent testing for sexually transmitted infections. We will notify you of any positive results once they are received. If required, we will prescribe any medications you might need.  Please refrain from all sexual activity for at least the next seven days.

## 2023-10-11 NOTE — ED Triage Notes (Signed)
 Patient here today to be tested for Stds. Patient states that someone he was with 5 months ago tested positive for Chlamydia and Herpes. Denies symptoms.

## 2023-10-12 LAB — CYTOLOGY, (ORAL, ANAL, URETHRAL) ANCILLARY ONLY
Chlamydia: POSITIVE — AB
Comment: NEGATIVE
Comment: NEGATIVE
Comment: NORMAL
Neisseria Gonorrhea: NEGATIVE
Trichomonas: NEGATIVE

## 2023-10-12 LAB — RPR: RPR Ser Ql: NONREACTIVE

## 2023-10-13 ENCOUNTER — Telehealth (HOSPITAL_COMMUNITY): Payer: Self-pay

## 2023-10-13 ENCOUNTER — Ambulatory Visit (HOSPITAL_COMMUNITY): Payer: Self-pay

## 2023-10-13 MED ORDER — DOXYCYCLINE HYCLATE 100 MG PO TABS: 100.0000 mg | ORAL_TABLET | Freq: Two times a day (BID) | ORAL | 0 refills | Status: DC

## 2023-10-13 NOTE — Telephone Encounter (Signed)
 Informed by front desk staff: PT would like to go over test results, and look for medication options Callback: [phone]   Cytology results listed below and routed to provider on site today for inquiry:   Component Ref Range & Units (hover) 2 d ago  Neisseria Gonorrhea Negative  Chlamydia Positive Abnormal   Trichomonas Negative  Comment Normal Reference Range Trichomonas - Negative  Comment Normal Reference Ranger Chlamydia - Negative  Comment Normal Reference Range Neisseria Gonorrhea - Negative  Resulting Agency [facility]        Specimen Collected: 10/11/23 20:11 Last Resulted: 10/12/23 17:08

## 2023-10-13 NOTE — Telephone Encounter (Signed)
Patient informed and verbalized understanding.  Medication sent in to preferred pharmacy per protocol.

## 2023-10-14 NOTE — ED Provider Notes (Signed)
 [facility]ARE [facility]ENTER   [id number] 10/11/23 Arrival Time: 1925  ASSESSMENT & PLAN:  1. Screening for STDs (sexually transmitted diseases)       Discharge Instructions      We have sent testing for sexually transmitted infections. We will notify you of any positive results once they are received. If required, we will prescribe any medications you might need.  Please refrain from all sexual activity for at least the next seven days.     Pending: Labs Reviewed  CYTOLOGY, (ORAL, ANAL, URETHRAL) ANCILLARY ONLY  RPR  HIV ANTIBODY (ROUTINE TESTING W REFLEX)    Will notify of any positive results. Instructed to refrain from sexual activity for at least seven days.  Reviewed expectations re: course of current medical issues. Questions answered. Outlined signs and symptoms indicating need for more acute intervention. Patient verbalized understanding. After Visit Summary given.   SUBJECTIVE:  Andre Little is a 23 y.o. male who is here today to be tested for STDs. Patient states that someone he was with 5 months ago tested positive for Chlamydia and Herpes. Denies symptoms.    OBJECTIVE:  Vitals:   10/11/23 2003  BP: 115/77  Pulse: 83  Resp: 16  Temp: 98.3 F (36.8 C)  TempSrc: Oral  SpO2: 98%     General appearance: alert, cooperative, appears stated age and no distress GU: deferred Skin: warm and dry Psychological: alert and cooperative; normal mood and affect.     No Known Allergies  Past Medical History:  Diagnosis Date   Acne    ADHD (attention deficit hyperactivity disorder)    Family history of adverse reaction to anesthesia    Mother   Family History  Problem Relation Age of Onset   Miscarriages / Stillbirths Mother    Asthma Father    COPD Father    Cancer Maternal Grandmother    Stroke Maternal Grandmother    Breast cancer Maternal Grandmother    Colon cancer Maternal Grandmother    Diabetes Paternal Grandmother    Cancer Paternal  Grandfather    Hearing loss Paternal Grandfather    Atrial fibrillation Paternal Grandfather    Migraines Sister    Social History   Socioeconomic History   Marital status: Single    Spouse name: Not on file   Number of children: Not on file   Years of education: 11   Highest education level: 11th grade  Occupational History   Not on file  Tobacco Use   Smoking status: Never   Smokeless tobacco: Never  Vaping Use   Vaping status: Never Used  Substance and Sexual Activity   Alcohol use: Yes    Comment: socially   Drug use: Never   Sexual activity: Not Currently  Other Topics Concern   Not on file  Social History Narrative   Johnmatthew is an 11th grade student.    He attends [employer]   He lives with both parents. He has one sister.   Caffeine occasional- maybe once a week   Social Drivers of Health   Financial Resource Strain: Medium Risk (08/01/2022)   Received from [facility]   Overall Financial Resource Strain (CARDIA)    Difficulty of Paying Living Expenses: Somewhat hard  Food Insecurity: No Food Insecurity (08/01/2022)   [facility] from [facility]   Hunger Vital Sign    Within the past 12 months, you worried that your food would run out before you got the money to buy more.: Never true  Within the past 12 months, the food you bought just didn't last and you didn't have money to get more.: Never true  Transportation Needs: No Transportation Needs (08/01/2022)   Received from [facility]   PRAPARE - Transportation    Lack of Transportation (Medical): No    Lack of Transportation (Non-Medical): No  Physical Activity: Sufficiently Active (08/01/2022)   Received from [facility]   Exercise Vital Sign    On average, how many days per week do you engage in moderate to strenuous exercise (like a brisk walk)?: 5 days    On average, how many minutes do you engage in exercise at this level?: 150+ min  Stress: No Stress Concern Present (08/01/2022)   Received  from [facility] - Occupational Stress Questionnaire    Feeling of Stress : Not at all  Social Connections: Socially Integrated (08/01/2022)   Received from [facility]   Social Network    How would you rate your social network (family, work, friends)?: Good participation with social networks  Intimate Partner Violence: Not At Risk (08/01/2022)   Received from [facility]   HITS    Over the last 12 months how often did your partner physically hurt you?: Never    Over the last 12 months how often did your partner insult you or talk down to you?: Never    Over the last 12 months how often did your partner threaten you with physical harm?: Never    Over the last 12 months how often did your partner scream or curse at you?: Never           Rolinda Rogue, MD 10/14/23 1034

## 2023-12-15 ENCOUNTER — Telehealth: Admitting: Family Medicine

## 2023-12-15 DIAGNOSIS — R109 Unspecified abdominal pain: Secondary | ICD-10-CM | POA: Diagnosis not present

## 2023-12-15 MED ORDER — HYOSCYAMINE SULFATE 0.125 MG PO TABS: ORAL_TABLET | ORAL | 0 refills | Status: DC

## 2023-12-15 MED ORDER — ONDANSETRON 4 MG PO TBDP: 4.0000 mg | ORAL_TABLET | Freq: Three times a day (TID) | ORAL | 0 refills | Status: DC | PRN

## 2023-12-15 NOTE — Patient Instructions (Signed)
Irritable Bowel Syndrome, Adult  Irritable bowel syndrome (IBS) is a group of symptoms that affects the organs responsible for digestion (gastrointestinal tract, or GI tract). IBS is not one specific disease. To regulate how the GI tract works, the body sends signals back and forth between the intestines and the brain. If you have IBS, there may be a problem with these signals. As a result, the GI tract does not function normally. The intestines may become more sensitive and overreact to certain things. This may be especially true when you eat certain foods or when you are under stress. There are four main types of IBS. These may be determined based on the consistency of your stool (feces): IBS with mostly (predominance of) diarrhea. IBS with predominance of constipation. IBS with mixed bowel habits. This includes both diarrhea and constipation. IBS unclassified. This includes IBS that cannot be categorized into one of the other three main types. It is important to know which type of IBS you have. Certain treatments are more likely to be helpful for certain types of IBS. What are the causes? The exact cause of IBS is not known. What increases the risk? You may have a higher risk for IBS if you: Are male. Are younger than 40 years. Have a family history of IBS. Have a mental health condition, such as depression, anxiety, or post-traumatic stress disorder. Have had a bacterial infection of your GI tract. What are the signs or symptoms? Symptoms of IBS vary from person to person. The main symptom is abdominal pain or discomfort. Other symptoms usually include one or more of the following: Diarrhea, constipation, or both. Swelling or bloating in the abdomen. Feeling full after eating a small or regular-sized meal. Frequent gas. Mucus in the stool. A feeling of having more stool left after a bowel movement. Symptoms tend to come and go. They may be triggered by stress, mental health  conditions, or certain foods. How is this diagnosed? This condition may be diagnosed based on a physical exam, your medical history, and your symptoms. You may have tests, such as: Blood tests. Stool test. Colonoscopy. This is a procedure in which your GI tract is viewed with a long, thin, flexible tube. How is this treated? There is no cure for IBS, but treatment can help relieve symptoms. Treatment depends on the type of IBS you have, and may include: Changes to your diet, such as: Avoiding foods that cause symptoms. Drinking more water. Following a low-FODMAP (fermentable oligosaccharides, disaccharides, monosaccharides, and polyols) diet for up to 6 weeks, or as told by your health care provider. FODMAPs are sugars that are hard for some people to digest. Eating more fiber. Eating small meals at the same times every day. Medicines. These may include: Fiber supplements, if you have constipation. Medicine to control diarrhea (antidiarrheal medicines). Medicine to help control muscle tightening (spasms) in your GI tract (antispasmodic medicines). Medicines to help with mental health conditions, such as antidepressants. Talk therapy or counseling. Working with a dietitian to help create a food plan that is right for you. Managing your stress. Follow these instructions at home: Eating and drinking  Eat a healthy diet. Eat 5-6 small meals a day. Try to eat meals at about the same times each day. Do not eat large meals. Gradually eat more fiber-rich foods. These include whole grains, fruits, and vegetables. This may be especially helpful if you have IBS with constipation. Eat a diet low in FODMAPs. You may need to avoid foods such as   citrus fruits, cabbage, garlic, and onions. Drink enough fluid to keep your urine pale yellow. Keep a journal of foods that seem to trigger symptoms. Avoid foods and drinks that: Contain added sugar. Make your symptoms worse. These may include dairy  products, caffeinated drinks, and carbonated drinks. Alcohol use Do not drink alcohol if: Your health care provider tells you not to drink. You are pregnant, may be pregnant, or are planning to become pregnant. If you drink alcohol: Limit how much you have to: 0-1 drink a day for women. 0-2 drinks a day for men. Know how much alcohol is in your drink. In the [place] one drink equals one 12 oz bottle of beer (355 mL), one 5 oz glass of wine (148 mL), or one 1 oz glass of hard liquor (44 mL) General instructions Take over-the-counter and prescription medicines only as told by your health care provider. This includes supplements. Get enough exercise. Do at least 150 minutes of moderate-intensity exercise each week. Manage your stress. Getting enough sleep and exercise can help you manage stress. Keep all follow-up visits. This is important. This includes all visits with your health care provider and therapist. Where to find more information International Foundation for Functional Gastrointestinal Disorders: aboutibs.org National Institute of Diabetes and Digestive and Kidney Diseases: niddk.nih.gov Contact a health care provider if: You have constant pain. You lose weight. You have diarrhea that gets worse. You have bleeding from the rectum. You vomit often. You have a fever. Get help right away if: You have severe abdominal pain. You have diarrhea with symptoms of dehydration, such as dizziness or dry mouth. You have bloody or black stools. You have severe abdominal bloating. You have vomiting that does not stop. You have blood in your vomit. Summary Irritable bowel syndrome (IBS) is not one specific disease. It is a group of symptoms that affects digestion. Your intestines may become more sensitive and overreact to certain things. This may be especially true when you eat certain foods or when you are under stress. There is no cure for IBS, but treatment can help relieve  symptoms. This information is not intended to replace advice given to you by your health care provider. Make sure you discuss any questions you have with your health care provider. Document Revised: 01/06/2021 Document Reviewed: 01/06/2021 Elsevier Patient Education  2024 [employer]vier Inc.  

## 2023-12-15 NOTE — Progress Notes (Signed)
 Virtual Visit Consent   [name], you are scheduled for a virtual visit with a [facility] provider today. Just as with appointments in the office, your consent must be obtained to participate. Your consent will be active for this visit and any virtual visit you may have with one of our providers in the next 365 days. If you have a MyChart account, a copy of this consent can be sent to you electronically.  As this is a virtual visit, video technology does not allow for your provider to perform a traditional examination. This may limit your provider's ability to fully assess your condition. If your provider identifies any concerns that need to be evaluated in person or the need to arrange testing (such as labs, EKG, etc.), we will make arrangements to do so. Although advances in technology are sophisticated, we cannot ensure that it will always work on either your end or our end. If the connection with a video visit is poor, the visit may have to be switched to a telephone visit. With either a video or telephone visit, we are not always able to ensure that we have a secure connection.  By engaging in this virtual visit, you consent to the provision of healthcare and authorize for your insurance to be billed (if applicable) for the services provided during this visit. Depending on your insurance coverage, you may receive a charge related to this service.  I need to obtain your verbal consent now. Are you willing to proceed with your visit today? Andre Little has provided verbal consent on 12/15/2023 for a virtual visit (video or telephone). [name], FNP  Date: 12/15/2023 3:34 PM   Virtual Visit via Video Note   I, [name], connected with  Andre Little  ([id number], Jun 19, 2000) on 12/15/23 at  3:30 PM EST by a video-enabled telemedicine application and verified that I am speaking with the correct person using two identifiers.  Location: Patient: Virtual Visit Location Patient:  Home Provider: Virtual Visit Location Provider: Home Office   I discussed the limitations of evaluation and management by telemedicine and the availability of in person appointments. The patient expressed understanding and agreed to proceed.    History of Present Illness: Andre Little is a 23 y.o. who identifies as a male who was assigned male at birth, and is being seen today for vomiting and nausea. This is recurrent problem, diarrhea, IBS back and forth to constipation  No fever. He needs referral to GI however I have advised him we are not able to provide referrals. He also needs meds refilled that have helped in the past. He also needs a work note for today.   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  Resting comfortably  at home.  Head is normocephalic, atraumatic.  No labored breathing.  Speech is clear and coherent with logical content.  Patient is alert and oriented at baseline.    Assessment and Plan:  1. Abdominal pain, unspecified abdominal location  Increase fluids, no milk or dairy, call GI and see if he can schedule without referral, if not he will see his pcp.   Follow Up Instructions: I discussed the assessment and treatment plan with the patient. The patient was provided an opportunity to ask questions and all were answered. The patient agreed with the plan and demonstrated  an understanding of the instructions.  A copy of instructions were sent to the patient via MyChart unless otherwise noted below.     The patient was advised to call back or seek an in-person evaluation if the symptoms worsen or if the condition fails to improve as anticipated.    Andre Brickley, FNP

## 2023-12-18 ENCOUNTER — Ambulatory Visit
Admission: RE | Admit: 2023-12-18 | Discharge: 2023-12-18 | Disposition: A | Source: Ambulatory Visit | Attending: Nurse Practitioner

## 2023-12-18 ENCOUNTER — Other Ambulatory Visit: Payer: Self-pay | Admitting: Nurse Practitioner

## 2023-12-18 DIAGNOSIS — M79661 Pain in right lower leg: Secondary | ICD-10-CM

## 2024-02-06 ENCOUNTER — Telehealth: Admitting: Family Medicine

## 2024-02-06 DIAGNOSIS — J069 Acute upper respiratory infection, unspecified: Secondary | ICD-10-CM

## 2024-02-06 MED ORDER — PROMETHAZINE-DM 6.25-15 MG/5ML PO SYRP: 5.0000 mL | ORAL_SOLUTION | Freq: Four times a day (QID) | ORAL | 0 refills | Status: AC | PRN

## 2024-02-06 MED ORDER — PROMETHAZINE-DM 6.25-15 MG/5ML PO SYRP: 5.0000 mL | ORAL_SOLUTION | Freq: Four times a day (QID) | ORAL | 0 refills | Status: DC | PRN

## 2024-02-06 NOTE — Progress Notes (Signed)
 " Virtual Visit Consent   [name], you are scheduled for a virtual visit with a [facility] provider today. Just as with appointments in the office, your consent must be obtained to participate. Your consent will be active for this visit and any virtual visit you may have with one of our providers in the next 365 days. If you have a MyChart account, a copy of this consent can be sent to you electronically.  As this is a virtual visit, video technology does not allow for your provider to perform a traditional examination. This may limit your provider's ability to fully assess your condition. If your provider identifies any concerns that need to be evaluated in person or the need to arrange testing (such as labs, EKG, etc.), we will make arrangements to do so. Although advances in technology are sophisticated, we cannot ensure that it will always work on either your end or our end. If the connection with a video visit is poor, the visit may have to be switched to a telephone visit. With either a video or telephone visit, we are not always able to ensure that we have a secure connection.  By engaging in this virtual visit, you consent to the provision of healthcare and authorize for your insurance to be billed (if applicable) for the services provided during this visit. Depending on your insurance coverage, you may receive a charge related to this service.  I need to obtain your verbal consent now. Are you willing to proceed with your visit today? BRETT DARKO has provided verbal consent on 02/06/2024 for a virtual visit (video or telephone). [name], FNP  Date: 02/06/2024 3:38 PM   Virtual Visit via Video Note   I, [name], connected with  Andre Little  ([id number], [birth year]) on 02/06/2024 at  3:30 PM EST by a video-enabled telemedicine application and verified that I am speaking with the correct person using two identifiers.  Location: Patient: Virtual Visit Location Patient:  Home Provider: Virtual Visit Location Provider: Home Office   I discussed the limitations of evaluation and management by telemedicine and the availability of in person appointments. The patient expressed understanding and agreed to proceed.    History of Present Illness: Andre Little is a 23 y.o. who identifies as a male who was assigned male at birth, and is being seen today for cough, chills, body aches, head congestion, sx started, sx started yesterday, no testing at home. He is concerned he caught pneumonia from his sister.   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  Resting comfortably  at home.  Head is normocephalic, atraumatic.  No labored breathing.  Speech is clear and coherent with logical content.  Patient is alert and oriented at baseline.    Assessment and Plan: 1. Viral URI with cough (Primary)  Increase fluids discussed viral illness and with sx starting yesterday this may be flu but is viral as there has not been enough time to develop pneumonia. Also pneumonia is not contagious. He was concerned because his sister had it.  May do otc flu and covid test and message  us  with results.   Follow Up Instructions: I discussed the assessment and treatment plan with the patient. The patient was provided an opportunity to ask questions and all were answered. The patient agreed with the plan and demonstrated an understanding of the instructions.  A copy of instructions were sent to the patient via MyChart unless otherwise  noted below.     The patient was advised to call back or seek an in-person evaluation if the symptoms worsen or if the condition fails to improve as anticipated.    Armistead Sult, FNP     [1] No Known Allergies [2]  Current Outpatient Medications:    doxycycline  (VIBRA -TABS) 100 MG tablet, Take 1 tablet (100 mg total) by mouth 2 (two) times daily., Disp: 14 tablet, Rfl: 0   hyoscyamine  (OSCIMIN ) 0.125 MG tablet, TAKE 1 TO 2 TABLETS AS NEEDED FOR CRAMPING, DIARRHEA EVERY 4 HOURS, Disp: 60 tablet, Rfl: 0   ondansetron  (ZOFRAN -ODT) 4 MG disintegrating tablet, Take 1 tablet (4 mg total) by mouth every 8 (eight) hours as needed for nausea or vomiting., Disp: 20 tablet, Rfl: 0   promethazine -dextromethorphan (PROMETHAZINE -DM) 6.25-15 MG/5ML syrup, Take 5 mLs by mouth 4 (four) times daily as needed for up to 10 days for cough., Disp: 118 mL, Rfl: 0  "

## 2024-02-06 NOTE — Patient Instructions (Signed)

## 2024-02-11 ENCOUNTER — Ambulatory Visit (INDEPENDENT_AMBULATORY_CARE_PROVIDER_SITE_OTHER)

## 2024-02-11 ENCOUNTER — Ambulatory Visit (HOSPITAL_COMMUNITY): Admission: EM | Admit: 2024-02-11 | Discharge: 2024-02-11 | Disposition: A | Discharge status: Home / Self Care

## 2024-02-11 ENCOUNTER — Encounter (HOSPITAL_COMMUNITY): Payer: Self-pay | Admitting: *Deleted

## 2024-02-11 ENCOUNTER — Other Ambulatory Visit: Payer: Self-pay

## 2024-02-11 DIAGNOSIS — R0989 Other specified symptoms and signs involving the circulatory and respiratory systems: Secondary | ICD-10-CM | POA: Diagnosis not present

## 2024-02-11 DIAGNOSIS — J069 Acute upper respiratory infection, unspecified: Secondary | ICD-10-CM

## 2024-02-11 MED ORDER — AZELASTINE HCL 0.1 % NA SOLN: 1.0000 | Freq: Two times a day (BID) | NASAL | 1 refills | Status: AC

## 2024-02-11 MED ORDER — PREDNISONE 20 MG PO TABS: 40.0000 mg | ORAL_TABLET | Freq: Every day | ORAL | 0 refills | Status: AC

## 2024-02-11 NOTE — Discharge Instructions (Signed)
" °  1. Viral upper respiratory tract infection (Primary) - DG Chest 2 View x-ray performed in UC shows no acute cardiopulmonary processes, no sign of consolidation or pneumonia, normal chest x-ray. - azelastine  (ASTELIN ) 0.1 % nasal spray; Place 1 spray into both nostrils 2 (two) times daily. Use in each nostril as directed  Dispense: 30 mL; Refill: 1 - predniSONE  (DELTASONE ) 20 MG tablet; Take 2 tablets (40 mg total) by mouth daily for 5 days.  Dispense: 10 tablet; Refill: 0 - Continue using previously prescribed promethazine  dextromethorphan cough syrup.  Take 10 mL 3 times daily as needed for cough suppression.  Cough medication is most effective if taken before going to sleep to suppress cough overnight. - Take Tylenol  1000 mg up to 3-4 times daily as needed for fever and/or bodyaches secondary to viral illness.  -Continue to monitor symptoms for any change in severity if there is any escalation of current symptoms or development of new symptoms follow-up in ER for further evaluation and management. "

## 2024-02-11 NOTE — ED Triage Notes (Signed)
 PT reports he is getting worse since his E-visit on Tuesday. Pt's fever has returned today 100.2. Pt reports cough is worse. PT is SHOB.

## 2024-02-11 NOTE — ED Provider Notes (Signed)
 " [facility]  Note:  This document was prepared using Dragon voice recognition software and may include unintentional dictation errors.  MRN: [id number] DOB: November 27, 2000  Subjective:   Andre Little is a 24 y.o. male presenting for fever, wheezing, cough, chest congestion, nasal congestion x 4 to 5 days.  Patient reports he had a virtual visit on Tuesday and was prescribed cough medication to help him sleep.  Patient reports that he began having fever again today, temp at home was 100.2.  Patient concerned that cough is getting worse and that he may have pneumonia or other bacterial infection.  No chest pain, weakness, dizziness, body aches, sore throat.  Patient denies any known sick contacts or known exposure to COVID, flu.  Current Medications[1]   Allergies[2]  Past Medical History:  Diagnosis Date   Acne    ADHD (attention deficit hyperactivity disorder)    Family history of adverse reaction to anesthesia    Mother     Past Surgical History:  Procedure Laterality Date   ADENOIDECTOMY     COLONOSCOPY N/A 03/29/2016   Procedure: COLONOSCOPY;  Surgeon: Charlie Elbe, MD;  Location: [facility] ENDOSCOPY;  Service: Gastroenterology;  Laterality: N/A;   ESOPHAGOGASTRODUODENOSCOPY N/A 03/29/2016   Procedure: ESOPHAGOGASTRODUODENOSCOPY (EGD);  Surgeon: Charlie Elbe, MD;  Location: [facility] ENDOSCOPY;  Service: Gastroenterology;  Laterality: N/A;   TONSILLECTOMY      Family History  Problem Relation Age of Onset   Miscarriages / Stillbirths Mother    Asthma Father    COPD Father    Cancer Maternal Grandmother    Stroke Maternal Grandmother    Breast cancer Maternal Grandmother    Colon cancer Maternal Grandmother    Diabetes Paternal Grandmother    Cancer Paternal Grandfather    Hearing loss Paternal Grandfather    Atrial fibrillation Paternal Grandfather    Migraines Sister     Social History[3]  ROS Refer to HPI for ROS details.  Objective:    Vitals: BP 126/82    Pulse 71   Temp 98.3 F (36.8 C)   Resp 18   SpO2 96%   Physical Exam Vitals and nursing note reviewed.  Constitutional:      General: He is not in acute distress.    Appearance: Normal appearance. He is well-developed. He is not ill-appearing or toxic-appearing.  HENT:     Head: Normocephalic.     Nose: Congestion present. No rhinorrhea.     Mouth/Throat:     Mouth: Mucous membranes are moist.     Pharynx: Oropharynx is clear.  Cardiovascular:     Rate and Rhythm: Normal rate.  Pulmonary:     Effort: Pulmonary effort is normal. No respiratory distress.     Breath sounds: No stridor. No wheezing.  Chest:     Chest wall: No tenderness.  Skin:    General: Skin is warm and dry.  Neurological:     General: No focal deficit present.     Mental Status: He is alert and oriented to person, place, and time.  Psychiatric:        Mood and Affect: Mood normal.        Behavior: Behavior normal.     Procedures  No results found for this or any previous visit (from the past 24 hours).  Assessment and Plan :     Discharge Instructions       1. Viral upper respiratory tract infection (Primary) - DG Chest 2 View x-ray performed in UC shows  no acute cardiopulmonary processes, no sign of consolidation or pneumonia, normal chest x-ray. - azelastine  (ASTELIN ) 0.1 % nasal spray; Place 1 spray into both nostrils 2 (two) times daily. Use in each nostril as directed  Dispense: 30 mL; Refill: 1 - predniSONE  (DELTASONE ) 20 MG tablet; Take 2 tablets (40 mg total) by mouth daily for 5 days.  Dispense: 10 tablet; Refill: 0 - Continue using previously prescribed promethazine  dextromethorphan cough syrup.  Take 10 mL 3 times daily as needed for cough suppression.  Cough medication is most effective if taken before going to sleep to suppress cough overnight. - Take Tylenol  1000 mg up to 3-4 times daily as needed for fever and/or bodyaches secondary to viral illness.  -Continue to monitor  symptoms for any change in severity if there is any escalation of current symptoms or development of new symptoms follow-up in ER for further evaluation and management.      Maddelyn Rocca B Tyrian Peart    [1] No current facility-administered medications for this encounter.  Current Outpatient Medications:    azelastine  (ASTELIN ) 0.1 % nasal spray, Place 1 spray into both nostrils 2 (two) times daily. Use in each nostril as directed, Disp: 30 mL, Rfl: 1   predniSONE  (DELTASONE ) 20 MG tablet, Take 2 tablets (40 mg total) by mouth daily for 5 days., Disp: 10 tablet, Rfl: 0   promethazine -dextromethorphan (PROMETHAZINE -DM) 6.25-15 MG/5ML syrup, Take 5 mLs by mouth 4 (four) times daily as needed for up to 10 days for cough., Disp: 118 mL, Rfl: 0 [2] No Known Allergies [3]  Social History Tobacco Use   Smoking status: Never   Smokeless tobacco: Never  Vaping Use   Vaping status: Never Used  Substance Use Topics   Alcohol use: Yes    Comment: socially   Drug use: Never     Aurea Goodell B, NP 02/11/24 1552  "

## 2024-02-12 ENCOUNTER — Ambulatory Visit (HOSPITAL_COMMUNITY)
